# Patient Record
Sex: Female | Born: 1955 | Race: Asian | Hispanic: No | Marital: Single | Smoking: Never smoker
Health system: Southern US, Community
[De-identification: ages and names within clinical notes are randomized; demographics above are authoritative.]

## PROBLEM LIST (undated history)

## (undated) DIAGNOSIS — C50919 Malignant neoplasm of unspecified site of unspecified female breast: Secondary | ICD-10-CM

## (undated) DIAGNOSIS — Z9221 Personal history of antineoplastic chemotherapy: Secondary | ICD-10-CM

## (undated) DIAGNOSIS — I10 Essential (primary) hypertension: Secondary | ICD-10-CM

## (undated) DIAGNOSIS — R05 Cough: Secondary | ICD-10-CM

## (undated) DIAGNOSIS — E785 Hyperlipidemia, unspecified: Secondary | ICD-10-CM

## (undated) DIAGNOSIS — R059 Cough, unspecified: Secondary | ICD-10-CM

## (undated) DIAGNOSIS — F419 Anxiety disorder, unspecified: Secondary | ICD-10-CM

## (undated) DIAGNOSIS — K30 Functional dyspepsia: Secondary | ICD-10-CM

## (undated) HISTORY — DX: Essential (primary) hypertension: I10

## (undated) HISTORY — PX: PORTACATH PLACEMENT: SHX2246

## (undated) HISTORY — DX: Malignant neoplasm of unspecified site of unspecified female breast: C50.919

## (undated) HISTORY — DX: Hyperlipidemia, unspecified: E78.5

## (undated) SURGERY — REMOVAL PORT-A-CATH
Anesthesia: General

---

## 2006-10-20 ENCOUNTER — Emergency Department (HOSPITAL_COMMUNITY): Admission: EM | Admit: 2006-10-20 | Discharge: 2006-10-20 | Payer: Self-pay | Admitting: Emergency Medicine

## 2007-05-03 ENCOUNTER — Ambulatory Visit: Payer: Self-pay | Admitting: Cardiology

## 2007-05-17 ENCOUNTER — Ambulatory Visit: Payer: Self-pay

## 2007-05-17 ENCOUNTER — Encounter: Payer: Self-pay | Admitting: Cardiology

## 2010-02-24 DIAGNOSIS — Z9221 Personal history of antineoplastic chemotherapy: Secondary | ICD-10-CM

## 2010-02-24 HISTORY — DX: Personal history of antineoplastic chemotherapy: Z92.21

## 2010-04-17 ENCOUNTER — Other Ambulatory Visit: Payer: Self-pay | Admitting: Family Medicine

## 2010-04-17 DIAGNOSIS — N6310 Unspecified lump in the right breast, unspecified quadrant: Secondary | ICD-10-CM

## 2010-04-22 ENCOUNTER — Ambulatory Visit
Admission: RE | Admit: 2010-04-22 | Discharge: 2010-04-22 | Disposition: A | Discharge status: Home / Self Care | Payer: Self-pay | Source: Ambulatory Visit | Attending: Family Medicine | Admitting: Family Medicine

## 2010-04-22 ENCOUNTER — Other Ambulatory Visit: Payer: Self-pay | Admitting: Family Medicine

## 2010-04-22 ENCOUNTER — Other Ambulatory Visit: Payer: Self-pay | Admitting: Diagnostic Radiology

## 2010-04-22 DIAGNOSIS — N6310 Unspecified lump in the right breast, unspecified quadrant: Secondary | ICD-10-CM

## 2010-04-23 ENCOUNTER — Other Ambulatory Visit: Payer: Self-pay | Admitting: Family Medicine

## 2010-04-23 DIAGNOSIS — C50911 Malignant neoplasm of unspecified site of right female breast: Secondary | ICD-10-CM

## 2010-04-25 ENCOUNTER — Ambulatory Visit (HOSPITAL_COMMUNITY)
Admission: RE | Admit: 2010-04-25 | Discharge: 2010-04-25 | Disposition: A | Discharge status: Home / Self Care | Payer: Self-pay | Source: Ambulatory Visit | Attending: Family Medicine | Admitting: Family Medicine

## 2010-04-25 DIAGNOSIS — C50911 Malignant neoplasm of unspecified site of right female breast: Secondary | ICD-10-CM

## 2010-04-25 DIAGNOSIS — C50419 Malignant neoplasm of upper-outer quadrant of unspecified female breast: Secondary | ICD-10-CM | POA: Insufficient documentation

## 2010-04-25 MED ORDER — GADOBENATE DIMEGLUMINE 529 MG/ML IV SOLN
13.0000 mL | Freq: Once | INTRAVENOUS | Status: AC | PRN
  Administered 2010-04-25: 13 mL via INTRAVENOUS

## 2010-05-01 ENCOUNTER — Other Ambulatory Visit: Payer: Self-pay | Admitting: Oncology

## 2010-05-01 ENCOUNTER — Encounter (HOSPITAL_BASED_OUTPATIENT_CLINIC_OR_DEPARTMENT_OTHER): Payer: Self-pay | Admitting: Oncology

## 2010-05-01 DIAGNOSIS — C50919 Malignant neoplasm of unspecified site of unspecified female breast: Secondary | ICD-10-CM

## 2010-05-01 DIAGNOSIS — C50419 Malignant neoplasm of upper-outer quadrant of unspecified female breast: Secondary | ICD-10-CM

## 2010-05-01 DIAGNOSIS — Z170 Estrogen receptor positive status [ER+]: Secondary | ICD-10-CM

## 2010-05-01 DIAGNOSIS — R509 Fever, unspecified: Secondary | ICD-10-CM

## 2010-05-01 LAB — CBC WITH DIFFERENTIAL/PLATELET
BASO%: 0.5 % (ref 0.0–2.0)
Basophils Absolute: 0 10*3/uL (ref 0.0–0.1)
EOS%: 7.2 % — ABNORMAL HIGH (ref 0.0–7.0)
HCT: 39.5 % (ref 34.8–46.6)
HGB: 12.6 g/dL (ref 11.6–15.9)
MCH: 23.3 pg — ABNORMAL LOW (ref 25.1–34.0)
MONO#: 0.4 10*3/uL (ref 0.1–0.9)
NEUT#: 4.5 10*3/uL (ref 1.5–6.5)
NEUT%: 61 % (ref 38.4–76.8)
RDW: 13.9 % (ref 11.2–14.5)
WBC: 7.4 10*3/uL (ref 3.9–10.3)
lymph#: 1.9 10*3/uL (ref 0.9–3.3)

## 2010-05-01 LAB — COMPREHENSIVE METABOLIC PANEL
ALT: 20 U/L (ref 0–35)
AST: 18 U/L (ref 0–37)
Albumin: 4.5 g/dL (ref 3.5–5.2)
BUN: 11 mg/dL (ref 6–23)
CO2: 23 mEq/L (ref 19–32)
Calcium: 9.5 mg/dL (ref 8.4–10.5)
Chloride: 102 mEq/L (ref 96–112)
Potassium: 3.8 mEq/L (ref 3.5–5.3)

## 2010-05-01 LAB — CANCER ANTIGEN 27.29: CA 27.29: 12 U/mL (ref 0–39)

## 2010-05-02 ENCOUNTER — Ambulatory Visit
Admission: RE | Admit: 2010-05-02 | Discharge: 2010-05-02 | Disposition: A | Discharge status: Home / Self Care | Payer: Self-pay | Source: Ambulatory Visit | Attending: Oncology | Admitting: Oncology

## 2010-05-02 DIAGNOSIS — C50919 Malignant neoplasm of unspecified site of unspecified female breast: Secondary | ICD-10-CM

## 2010-05-03 ENCOUNTER — Other Ambulatory Visit (HOSPITAL_COMMUNITY): Payer: Self-pay

## 2010-05-06 ENCOUNTER — Encounter (HOSPITAL_COMMUNITY): Payer: Self-pay

## 2010-05-06 ENCOUNTER — Other Ambulatory Visit (HOSPITAL_COMMUNITY): Payer: Self-pay | Admitting: General Surgery

## 2010-05-06 ENCOUNTER — Inpatient Hospital Stay (HOSPITAL_COMMUNITY)
Admission: RE | Admit: 2010-05-06 | Discharge: 2010-05-06 | Disposition: A | Discharge status: Home / Self Care | Payer: Self-pay | Source: Ambulatory Visit

## 2010-05-06 ENCOUNTER — Ambulatory Visit (HOSPITAL_COMMUNITY)
Admission: RE | Admit: 2010-05-06 | Discharge: 2010-05-06 | Disposition: A | Discharge status: Home / Self Care | Payer: Self-pay | Source: Ambulatory Visit | Attending: Oncology | Admitting: Oncology

## 2010-05-06 ENCOUNTER — Ambulatory Visit (HOSPITAL_COMMUNITY)
Admission: RE | Admit: 2010-05-06 | Discharge: 2010-05-06 | Disposition: A | Discharge status: Home / Self Care | Payer: Self-pay | Source: Ambulatory Visit | Attending: General Surgery | Admitting: General Surgery

## 2010-05-06 ENCOUNTER — Other Ambulatory Visit: Payer: Self-pay | Admitting: General Surgery

## 2010-05-06 ENCOUNTER — Ambulatory Visit (HOSPITAL_COMMUNITY): Admission: RE | Admit: 2010-05-06 | Payer: Self-pay | Source: Ambulatory Visit

## 2010-05-06 DIAGNOSIS — R9431 Abnormal electrocardiogram [ECG] [EKG]: Secondary | ICD-10-CM | POA: Insufficient documentation

## 2010-05-06 DIAGNOSIS — Z01812 Encounter for preprocedural laboratory examination: Secondary | ICD-10-CM | POA: Insufficient documentation

## 2010-05-06 DIAGNOSIS — Z01810 Encounter for preprocedural cardiovascular examination: Secondary | ICD-10-CM | POA: Insufficient documentation

## 2010-05-06 DIAGNOSIS — C50919 Malignant neoplasm of unspecified site of unspecified female breast: Secondary | ICD-10-CM | POA: Insufficient documentation

## 2010-05-06 DIAGNOSIS — Z01818 Encounter for other preprocedural examination: Secondary | ICD-10-CM

## 2010-05-06 DIAGNOSIS — I517 Cardiomegaly: Secondary | ICD-10-CM

## 2010-05-07 ENCOUNTER — Ambulatory Visit (HOSPITAL_COMMUNITY): Payer: Self-pay

## 2010-05-07 ENCOUNTER — Other Ambulatory Visit (HOSPITAL_COMMUNITY): Payer: Self-pay

## 2010-05-07 ENCOUNTER — Ambulatory Visit (HOSPITAL_COMMUNITY)
Admission: RE | Admit: 2010-05-07 | Discharge: 2010-05-07 | Disposition: A | Discharge status: Home / Self Care | Payer: Self-pay | Source: Ambulatory Visit | Attending: General Surgery | Admitting: General Surgery

## 2010-05-07 DIAGNOSIS — Z853 Personal history of malignant neoplasm of breast: Secondary | ICD-10-CM

## 2010-05-07 DIAGNOSIS — C50919 Malignant neoplasm of unspecified site of unspecified female breast: Secondary | ICD-10-CM | POA: Insufficient documentation

## 2010-05-07 LAB — URINE MICROSCOPIC-ADD ON

## 2010-05-07 LAB — URINALYSIS, ROUTINE W REFLEX MICROSCOPIC
Bilirubin Urine: NEGATIVE
Glucose, UA: NEGATIVE mg/dL
Ketones, ur: NEGATIVE mg/dL
Protein, ur: NEGATIVE mg/dL
pH: 6 (ref 5.0–8.0)

## 2010-05-09 ENCOUNTER — Encounter (HOSPITAL_COMMUNITY): Payer: Self-pay

## 2010-05-09 ENCOUNTER — Encounter (HOSPITAL_COMMUNITY)
Admission: RE | Admit: 2010-05-09 | Discharge: 2010-05-09 | Disposition: A | Discharge status: Home / Self Care | Payer: Self-pay | Source: Ambulatory Visit | Attending: Oncology | Admitting: Oncology

## 2010-05-09 DIAGNOSIS — C50919 Malignant neoplasm of unspecified site of unspecified female breast: Secondary | ICD-10-CM | POA: Insufficient documentation

## 2010-05-09 LAB — GLUCOSE, CAPILLARY: Glucose-Capillary: 147 mg/dL — ABNORMAL HIGH (ref 70–99)

## 2010-05-09 MED ORDER — FLUDEOXYGLUCOSE F - 18 (FDG) INJECTION
16.3000 | Freq: Once | INTRAVENOUS | Status: AC | PRN
  Administered 2010-05-09: 16.3 via INTRAVENOUS

## 2010-05-10 NOTE — Op Note (Signed)
NAMEJULIYAH, [name]             ACCOUNT NO.:  [id number]  MEDICAL RECORD NO.:  [id number]           PATIENT TYPE:  O  LOCATION:  DAYL                         FACILITY:  [facility]  PHYSICIAN:  Almond Lint, MD       DATE OF BIRTH:  28-Mar-1955  DATE OF PROCEDURE:  05/07/2010 DATE OF DISCHARGE:                              OPERATIVE REPORT   PREOPERATIVE DIAGNOSIS:  Clinical T3 N0 right breast cancer.  POSTOPERATIVE DIAGNOSIS:  Clinical T3 N0 right breast cancer.  PROCEDURE:  Left subclavian Port-A-Cath placement.  SURGEON:  Almond Lint, M.D.  ANESTHESIA:  MAC and local.  FINDINGS:  Good venous return and easy flush, catheter cut at 20 cm.  ESTIMATED BLOOD LOSS:  Minimal.  COMPLICATIONS:  None known.  PROCEDURE:  [name] was identified in the holding area and taken to operating room where she was placed on the operating room table.  MAC anesthesia was induced.  Her arms were tucked and a shoulder roll was placed, and she was placed into Trendelenburg position.  Her chest was prepped and draped in sterile fashion.  Time-out was performed according to surgical safety check list.  When all was correct, we continued.  The infraclavicular skin and underlying region was anesthetized with local anesthetic.  A large-bore needle was used to access the subclavian vein with 1 pass in the needle.  The wire passed easily, and ectopy was seen. The wire was withdrawn until the ectopy stopped.  This was then clamped to the drape.  Additional local anesthetic was infiltrated near the site of the skin and in the area for the pocket.  A 3-cm incision was made incorporating the wire insertion site.  The wire was cut away from the skin with the Metzenbaum scissors.  The dissection was taken down to the fascia with the cautery.  The [employer] was then used to elevate the skin in order to create a pocket on top of the pectoralis fascia.  Four 2-0 Prolene sutures were used to tack the port  down to the pectoralis fascia.  Care was taken to make sure the port fitted in the pocket well. The catheter was laid over the wire and cut to 20 cm to be at the SVC/RA junction.  The tunneler and dilator were placed over the wire under fluoroscopy and then the tunneler sheath was left in place.  The wire and dilator were removed.  The catheter was advanced through the tunneler sheath, and the tunneler sheath was pulled away slowly.  The Port-A-Cath aspirated and flushed easily with a dilute heparin flush. The position was confirmed to be SVC/RA junction on fluoroscopy.  The catheter was then tied down with Prolene sutures and a 3-0 Vicryl was placed at the neck of the catheter and port to hold it down as well. The 3-0 Vicryls were used to place deep dermal sutures in the skin and the catheter position was rechecked and remained in the right position. A 4-0 Monocryl was then used to run the skin in subcuticular fashion.  The wound was then cleaned, dried, and dressed with Dermabond.  The patient tolerated the procedure  well and was allowed to emerge from anesthesia. Taken to PACU in stable condition.  Needle, sponge, and instrument counts were correct x2.     Almond Lint, MD     FB/MEDQ  D:  05/07/2010  T:  05/07/2010  Job:  [id number]  Electronically Signed by Almond Lint MD on 05/10/2010 07:53:57 AM

## 2010-05-13 ENCOUNTER — Encounter (HOSPITAL_BASED_OUTPATIENT_CLINIC_OR_DEPARTMENT_OTHER): Payer: Self-pay | Admitting: Oncology

## 2010-05-13 DIAGNOSIS — C50419 Malignant neoplasm of upper-outer quadrant of unspecified female breast: Secondary | ICD-10-CM

## 2010-05-16 ENCOUNTER — Encounter (HOSPITAL_BASED_OUTPATIENT_CLINIC_OR_DEPARTMENT_OTHER): Payer: Self-pay | Admitting: Oncology

## 2010-05-16 DIAGNOSIS — Z170 Estrogen receptor positive status [ER+]: Secondary | ICD-10-CM

## 2010-05-16 DIAGNOSIS — C50419 Malignant neoplasm of upper-outer quadrant of unspecified female breast: Secondary | ICD-10-CM

## 2010-05-16 DIAGNOSIS — Z5111 Encounter for antineoplastic chemotherapy: Secondary | ICD-10-CM

## 2010-05-16 DIAGNOSIS — Z5112 Encounter for antineoplastic immunotherapy: Secondary | ICD-10-CM

## 2010-05-23 ENCOUNTER — Other Ambulatory Visit: Payer: Self-pay | Admitting: Oncology

## 2010-05-23 ENCOUNTER — Encounter (HOSPITAL_BASED_OUTPATIENT_CLINIC_OR_DEPARTMENT_OTHER): Payer: Self-pay | Admitting: Oncology

## 2010-05-23 DIAGNOSIS — Z5111 Encounter for antineoplastic chemotherapy: Secondary | ICD-10-CM

## 2010-05-23 DIAGNOSIS — Z170 Estrogen receptor positive status [ER+]: Secondary | ICD-10-CM

## 2010-05-23 DIAGNOSIS — Z5112 Encounter for antineoplastic immunotherapy: Secondary | ICD-10-CM

## 2010-05-23 DIAGNOSIS — C50419 Malignant neoplasm of upper-outer quadrant of unspecified female breast: Secondary | ICD-10-CM

## 2010-05-23 LAB — BASIC METABOLIC PANEL
CO2: 23 mEq/L (ref 19–32)
Chloride: 104 mEq/L (ref 96–112)
Glucose, Bld: 244 mg/dL — ABNORMAL HIGH (ref 70–99)
Potassium: 3.9 mEq/L (ref 3.5–5.3)
Sodium: 137 mEq/L (ref 135–145)

## 2010-05-23 LAB — CBC WITH DIFFERENTIAL/PLATELET
Eosinophils Absolute: 0.5 10*3/uL (ref 0.0–0.5)
MONO#: 0.4 10*3/uL (ref 0.1–0.9)
MONO%: 5 % (ref 0.0–14.0)
NEUT#: 4.4 10*3/uL (ref 1.5–6.5)
RBC: 4.85 10*6/uL (ref 3.70–5.45)
RDW: 14.5 % (ref 11.2–14.5)
WBC: 7.6 10*3/uL (ref 3.9–10.3)
lymph#: 2.3 10*3/uL (ref 0.9–3.3)

## 2010-05-30 ENCOUNTER — Other Ambulatory Visit: Payer: Self-pay | Admitting: Physician Assistant

## 2010-05-30 ENCOUNTER — Encounter (HOSPITAL_BASED_OUTPATIENT_CLINIC_OR_DEPARTMENT_OTHER): Payer: Self-pay | Admitting: Oncology

## 2010-05-30 DIAGNOSIS — Z5111 Encounter for antineoplastic chemotherapy: Secondary | ICD-10-CM

## 2010-05-30 DIAGNOSIS — Z5112 Encounter for antineoplastic immunotherapy: Secondary | ICD-10-CM

## 2010-05-30 DIAGNOSIS — C50419 Malignant neoplasm of upper-outer quadrant of unspecified female breast: Secondary | ICD-10-CM

## 2010-05-30 DIAGNOSIS — Z170 Estrogen receptor positive status [ER+]: Secondary | ICD-10-CM

## 2010-05-30 LAB — CBC WITH DIFFERENTIAL/PLATELET
BASO%: 0.9 % (ref 0.0–2.0)
HCT: 37.6 % (ref 34.8–46.6)
LYMPH%: 34.8 % (ref 14.0–49.7)
MCHC: 31.9 g/dL (ref 31.5–36.0)
MONO#: 0.4 10*3/uL (ref 0.1–0.9)
NEUT%: 52.5 % (ref 38.4–76.8)
Platelets: 339 10*3/uL (ref 145–400)
WBC: 5.6 10*3/uL (ref 3.9–10.3)

## 2010-06-06 ENCOUNTER — Encounter (HOSPITAL_BASED_OUTPATIENT_CLINIC_OR_DEPARTMENT_OTHER): Payer: Self-pay | Admitting: Oncology

## 2010-06-06 ENCOUNTER — Other Ambulatory Visit: Payer: Self-pay | Admitting: Oncology

## 2010-06-06 DIAGNOSIS — R509 Fever, unspecified: Secondary | ICD-10-CM

## 2010-06-06 DIAGNOSIS — Z170 Estrogen receptor positive status [ER+]: Secondary | ICD-10-CM

## 2010-06-06 DIAGNOSIS — Z5112 Encounter for antineoplastic immunotherapy: Secondary | ICD-10-CM

## 2010-06-06 DIAGNOSIS — C50419 Malignant neoplasm of upper-outer quadrant of unspecified female breast: Secondary | ICD-10-CM

## 2010-06-06 LAB — CBC WITH DIFFERENTIAL/PLATELET
BASO%: 1 % (ref 0.0–2.0)
Eosinophils Absolute: 0.3 10*3/uL (ref 0.0–0.5)
HCT: 38.2 % (ref 34.8–46.6)
HGB: 12.1 g/dL (ref 11.6–15.9)
MCH: 23.6 pg — ABNORMAL LOW (ref 25.1–34.0)
MCHC: 31.7 g/dL (ref 31.5–36.0)
MCV: 74.5 fL — ABNORMAL LOW (ref 79.5–101.0)
MONO#: 0.4 10*3/uL (ref 0.1–0.9)
RBC: 5.13 10*6/uL (ref 3.70–5.45)
RDW: 15.3 % — ABNORMAL HIGH (ref 11.2–14.5)
lymph#: 2.2 10*3/uL (ref 0.9–3.3)
nRBC: 0 % (ref 0–0)

## 2010-06-06 LAB — BASIC METABOLIC PANEL
CO2: 24 mEq/L (ref 19–32)
Chloride: 106 mEq/L (ref 96–112)
Creatinine, Ser: 0.8 mg/dL (ref 0.40–1.20)
Potassium: 3.7 mEq/L (ref 3.5–5.3)
Sodium: 141 mEq/L (ref 135–145)

## 2010-06-13 ENCOUNTER — Other Ambulatory Visit: Payer: Self-pay | Admitting: Physician Assistant

## 2010-06-13 ENCOUNTER — Encounter (HOSPITAL_BASED_OUTPATIENT_CLINIC_OR_DEPARTMENT_OTHER): Payer: Self-pay | Admitting: Oncology

## 2010-06-13 DIAGNOSIS — Z5112 Encounter for antineoplastic immunotherapy: Secondary | ICD-10-CM

## 2010-06-13 DIAGNOSIS — C50419 Malignant neoplasm of upper-outer quadrant of unspecified female breast: Secondary | ICD-10-CM

## 2010-06-13 DIAGNOSIS — Z170 Estrogen receptor positive status [ER+]: Secondary | ICD-10-CM

## 2010-06-13 DIAGNOSIS — Z5111 Encounter for antineoplastic chemotherapy: Secondary | ICD-10-CM

## 2010-06-13 LAB — CBC WITH DIFFERENTIAL/PLATELET
Basophils Absolute: 0.1 10*3/uL (ref 0.0–0.1)
EOS%: 5.7 % (ref 0.0–7.0)
Eosinophils Absolute: 0.4 10*3/uL (ref 0.0–0.5)
HCT: 38 % (ref 34.8–46.6)
HGB: 12.1 g/dL (ref 11.6–15.9)
MCH: 23.5 pg — ABNORMAL LOW (ref 25.1–34.0)
MCV: 73.8 fL — ABNORMAL LOW (ref 79.5–101.0)
MONO%: 8.7 % (ref 0.0–14.0)
NEUT#: 3.7 10*3/uL (ref 1.5–6.5)
NEUT%: 52.2 % (ref 38.4–76.8)
Platelets: 301 10*3/uL (ref 145–400)
RDW: 15.3 % — ABNORMAL HIGH (ref 11.2–14.5)

## 2010-06-13 LAB — COMPREHENSIVE METABOLIC PANEL
Albumin: 4.6 g/dL (ref 3.5–5.2)
BUN: 10 mg/dL (ref 6–23)
CO2: 24 mEq/L (ref 19–32)
Glucose, Bld: 168 mg/dL — ABNORMAL HIGH (ref 70–99)
Sodium: 141 mEq/L (ref 135–145)
Total Bilirubin: 0.5 mg/dL (ref 0.3–1.2)
Total Protein: 7.3 g/dL (ref 6.0–8.3)

## 2010-06-20 ENCOUNTER — Encounter (HOSPITAL_BASED_OUTPATIENT_CLINIC_OR_DEPARTMENT_OTHER): Payer: Self-pay | Admitting: Oncology

## 2010-06-20 ENCOUNTER — Other Ambulatory Visit: Payer: Self-pay | Admitting: Physician Assistant

## 2010-06-20 DIAGNOSIS — C50419 Malignant neoplasm of upper-outer quadrant of unspecified female breast: Secondary | ICD-10-CM

## 2010-06-20 DIAGNOSIS — Z5112 Encounter for antineoplastic immunotherapy: Secondary | ICD-10-CM

## 2010-06-20 DIAGNOSIS — Z5111 Encounter for antineoplastic chemotherapy: Secondary | ICD-10-CM

## 2010-06-20 DIAGNOSIS — Z170 Estrogen receptor positive status [ER+]: Secondary | ICD-10-CM

## 2010-06-20 LAB — CBC WITH DIFFERENTIAL/PLATELET
Basophils Absolute: 0.1 10*3/uL (ref 0.0–0.1)
Eosinophils Absolute: 0.6 10*3/uL — ABNORMAL HIGH (ref 0.0–0.5)
HGB: 12.4 g/dL (ref 11.6–15.9)
MONO#: 0.5 10*3/uL (ref 0.1–0.9)
NEUT#: 6.7 10*3/uL — ABNORMAL HIGH (ref 1.5–6.5)
Platelets: 343 10*3/uL (ref 145–400)
RBC: 5.29 10*6/uL (ref 3.70–5.45)
RDW: 15.3 % — ABNORMAL HIGH (ref 11.2–14.5)
WBC: 10.4 10*3/uL — ABNORMAL HIGH (ref 3.9–10.3)
nRBC: 0 % (ref 0–0)

## 2010-06-27 ENCOUNTER — Other Ambulatory Visit: Payer: Self-pay | Admitting: Physician Assistant

## 2010-06-27 ENCOUNTER — Encounter (HOSPITAL_BASED_OUTPATIENT_CLINIC_OR_DEPARTMENT_OTHER): Payer: Self-pay | Admitting: Oncology

## 2010-06-27 DIAGNOSIS — Z5111 Encounter for antineoplastic chemotherapy: Secondary | ICD-10-CM

## 2010-06-27 DIAGNOSIS — C50419 Malignant neoplasm of upper-outer quadrant of unspecified female breast: Secondary | ICD-10-CM

## 2010-06-27 DIAGNOSIS — Z170 Estrogen receptor positive status [ER+]: Secondary | ICD-10-CM

## 2010-06-27 DIAGNOSIS — Z5112 Encounter for antineoplastic immunotherapy: Secondary | ICD-10-CM

## 2010-06-27 LAB — CBC WITH DIFFERENTIAL/PLATELET
BASO%: 0.7 % (ref 0.0–2.0)
Basophils Absolute: 0.1 10*3/uL (ref 0.0–0.1)
Eosinophils Absolute: 0.4 10*3/uL (ref 0.0–0.5)
HCT: 36 % (ref 34.8–46.6)
HGB: 11.5 g/dL — ABNORMAL LOW (ref 11.6–15.9)
MONO#: 0.6 10*3/uL (ref 0.1–0.9)
NEUT#: 4.7 10*3/uL (ref 1.5–6.5)
NEUT%: 56.8 % (ref 38.4–76.8)
WBC: 8.2 10*3/uL (ref 3.9–10.3)
lymph#: 2.5 10*3/uL (ref 0.9–3.3)

## 2010-06-30 ENCOUNTER — Emergency Department (HOSPITAL_COMMUNITY)
Admission: EM | Admit: 2010-06-30 | Discharge: 2010-07-01 | Disposition: A | Discharge status: Home / Self Care | Payer: Self-pay | Attending: Emergency Medicine | Admitting: Emergency Medicine

## 2010-06-30 ENCOUNTER — Encounter (HOSPITAL_COMMUNITY): Payer: Self-pay

## 2010-06-30 ENCOUNTER — Emergency Department (HOSPITAL_COMMUNITY): Payer: Self-pay

## 2010-06-30 DIAGNOSIS — I10 Essential (primary) hypertension: Secondary | ICD-10-CM | POA: Insufficient documentation

## 2010-06-30 DIAGNOSIS — R079 Chest pain, unspecified: Secondary | ICD-10-CM | POA: Insufficient documentation

## 2010-06-30 DIAGNOSIS — C50919 Malignant neoplasm of unspecified site of unspecified female breast: Secondary | ICD-10-CM | POA: Insufficient documentation

## 2010-06-30 DIAGNOSIS — IMO0001 Reserved for inherently not codable concepts without codable children: Secondary | ICD-10-CM | POA: Insufficient documentation

## 2010-06-30 DIAGNOSIS — Z79899 Other long term (current) drug therapy: Secondary | ICD-10-CM | POA: Insufficient documentation

## 2010-06-30 LAB — DIFFERENTIAL
Basophils Absolute: 0 10*3/uL (ref 0.0–0.1)
Basophils Relative: 1 % (ref 0–1)
Eosinophils Absolute: 0.2 10*3/uL (ref 0.0–0.7)
Eosinophils Relative: 2 % (ref 0–5)
Lymphocytes Relative: 30 % (ref 12–46)
Lymphs Abs: 2 10*3/uL (ref 0.7–4.0)
Monocytes Absolute: 0.3 10*3/uL (ref 0.1–1.0)
Monocytes Relative: 4 % (ref 3–12)
Neutro Abs: 4.1 10*3/uL (ref 1.7–7.7)
Neutrophils Relative %: 63 % (ref 43–77)

## 2010-06-30 LAB — CBC
MCH: 23.9 pg — ABNORMAL LOW (ref 26.0–34.0)
MCV: 73.9 fL — ABNORMAL LOW (ref 78.0–100.0)
Platelets: 342 10*3/uL (ref 150–400)
RBC: 5.45 MIL/uL — ABNORMAL HIGH (ref 3.87–5.11)
RDW: 15.2 % (ref 11.5–15.5)
WBC: 6.6 10*3/uL (ref 4.0–10.5)

## 2010-06-30 LAB — POCT I-STAT, CHEM 8
BUN: 8 mg/dL (ref 6–23)
Chloride: 102 mEq/L (ref 96–112)
Creatinine, Ser: 0.8 mg/dL (ref 0.4–1.2)
Glucose, Bld: 148 mg/dL — ABNORMAL HIGH (ref 70–99)
HCT: 46 % (ref 36.0–46.0)
Potassium: 3.5 mEq/L (ref 3.5–5.1)

## 2010-06-30 LAB — URINALYSIS, ROUTINE W REFLEX MICROSCOPIC
Bilirubin Urine: NEGATIVE
Ketones, ur: NEGATIVE mg/dL
Nitrite: NEGATIVE
Specific Gravity, Urine: 1.013 (ref 1.005–1.030)
Urobilinogen, UA: 0.2 mg/dL (ref 0.0–1.0)
pH: 7 (ref 5.0–8.0)

## 2010-06-30 LAB — URINE MICROSCOPIC-ADD ON

## 2010-06-30 MED ORDER — IOHEXOL 300 MG/ML  SOLN
100.0000 mL | Freq: Once | INTRAMUSCULAR | Status: AC | PRN
  Administered 2010-06-30: 100 mL via INTRAVENOUS

## 2010-07-04 ENCOUNTER — Other Ambulatory Visit: Payer: Self-pay | Admitting: Physician Assistant

## 2010-07-04 ENCOUNTER — Encounter (HOSPITAL_BASED_OUTPATIENT_CLINIC_OR_DEPARTMENT_OTHER): Payer: Self-pay | Admitting: Oncology

## 2010-07-04 DIAGNOSIS — C50919 Malignant neoplasm of unspecified site of unspecified female breast: Secondary | ICD-10-CM

## 2010-07-04 DIAGNOSIS — C50419 Malignant neoplasm of upper-outer quadrant of unspecified female breast: Secondary | ICD-10-CM

## 2010-07-04 DIAGNOSIS — Z5112 Encounter for antineoplastic immunotherapy: Secondary | ICD-10-CM

## 2010-07-04 DIAGNOSIS — Z170 Estrogen receptor positive status [ER+]: Secondary | ICD-10-CM

## 2010-07-04 LAB — CBC WITH DIFFERENTIAL/PLATELET
BASO%: 0.4 % (ref 0.0–2.0)
EOS%: 2.2 % (ref 0.0–7.0)
HCT: 37.7 % (ref 34.8–46.6)
LYMPH%: 26.8 % (ref 14.0–49.7)
MCH: 23.4 pg — ABNORMAL LOW (ref 25.1–34.0)
MCHC: 31.8 g/dL (ref 31.5–36.0)
MONO%: 6.5 % (ref 0.0–14.0)
NEUT%: 64.1 % (ref 38.4–76.8)
Platelets: 345 10*3/uL (ref 145–400)
RBC: 5.12 10*6/uL (ref 3.70–5.45)
nRBC: 0 % (ref 0–0)

## 2010-07-09 NOTE — Assessment & Plan Note (Signed)
[name]                            CARDIOLOGY OFFICE NOTE   NAME:[name], [name]                      MRN:          [id number]  DATE:05/03/2007                            DOB:          [birth year]    The patient is 55 years old.  She had been seen at [facility]  She was  having some chest discomfort.  She does not speak [place].  She is here  with a family member today.  She has had some abdominal discomfort and  also some slight chest discomfort.  The chest discomfort was not  exertional.  Her EKG reveals some nonspecific ST-T wave changes.  She  was referred for further evaluation to be sure that her cardiac status  was stable.  There was some increased voltage on her EKG.   PAST MEDICAL HISTORY:   ALLERGIES:  There is history of allergy to ASPIRIN.   MEDICATIONS:  1. Hydrochlorothiazide 25.  2. Potassium 20.  3. Prilosec.   OTHER MEDICAL PROBLEMS:  See the list below.   SOCIAL HISTORY:  She is single and works doing Psychologist, educational   FAMILY HISTORY:  We cannot obtain a good family history.   REVIEW OF SYSTEMS:  As of today, she has very mild rare intermittent GI  symptoms.  Otherwise, her review of systems is negative.   PHYSICAL EXAM:  Blood pressure is 123/80 with a pulse of 68.  The patient is oriented to person, time and place.  Affect is normal.  She communicates through her family member.  HEENT:  Reveals no xanthelasma.  She has normal extraocular motion.  There are no carotid bruits.  There is no jugular venous distention.  LUNGS:  Clear.  Respiratory effort is not labored.  CARDIAC:  Exam reveals S1 with an S2.  There are no clicks or  significant murmurs.  ABDOMEN:  Soft.  She has normal distal pulses.  There is no peripheral  edema.   I have an EKG from April 03, 2007.  There is no repeat done.  There  are nonspecific ST-T wave changes.   PROBLEMS:  1. Recent mild gastrointestinal symptoms, on medication.  2. Recent  chest pain.  At this point, we have no proof of an acute      coronary syndrome.  She appears to be stable.  3. Mild nonspecific ST-T wave changes.   The patient is allowed to continue to do her work up.  She will have a 2-  D echocardiogram to be sure she has normal left ventricular function.  I  will then see her back for followup.  No change in her medications at  this time.     Luis Abed, MD, [facility]  Electronically Signed    JDK/MedQ  DD: 05/03/2007  DT: 05/04/2007  Job #: [id number]   cc:   [name], [address] [place], [place] [address] Sabrina Gobble PA-C

## 2010-07-11 ENCOUNTER — Other Ambulatory Visit: Payer: Self-pay | Admitting: Oncology

## 2010-07-11 ENCOUNTER — Other Ambulatory Visit: Payer: Self-pay | Admitting: Physician Assistant

## 2010-07-11 ENCOUNTER — Encounter (HOSPITAL_BASED_OUTPATIENT_CLINIC_OR_DEPARTMENT_OTHER): Payer: Self-pay | Admitting: Oncology

## 2010-07-11 ENCOUNTER — Ambulatory Visit (HOSPITAL_COMMUNITY)
Admission: RE | Admit: 2010-07-11 | Discharge: 2010-07-11 | Disposition: A | Discharge status: Home / Self Care | Payer: Self-pay | Source: Ambulatory Visit | Attending: Oncology | Admitting: Oncology

## 2010-07-11 DIAGNOSIS — C50919 Malignant neoplasm of unspecified site of unspecified female breast: Secondary | ICD-10-CM | POA: Insufficient documentation

## 2010-07-11 DIAGNOSIS — M549 Dorsalgia, unspecified: Secondary | ICD-10-CM

## 2010-07-11 DIAGNOSIS — Z5112 Encounter for antineoplastic immunotherapy: Secondary | ICD-10-CM

## 2010-07-11 DIAGNOSIS — C50419 Malignant neoplasm of upper-outer quadrant of unspecified female breast: Secondary | ICD-10-CM

## 2010-07-11 DIAGNOSIS — Z5111 Encounter for antineoplastic chemotherapy: Secondary | ICD-10-CM

## 2010-07-11 DIAGNOSIS — M542 Cervicalgia: Secondary | ICD-10-CM | POA: Insufficient documentation

## 2010-07-11 DIAGNOSIS — Z170 Estrogen receptor positive status [ER+]: Secondary | ICD-10-CM

## 2010-07-11 DIAGNOSIS — M546 Pain in thoracic spine: Secondary | ICD-10-CM | POA: Insufficient documentation

## 2010-07-11 LAB — COMPREHENSIVE METABOLIC PANEL
AST: 18 U/L (ref 0–37)
Alkaline Phosphatase: 59 U/L (ref 39–117)
Glucose, Bld: 154 mg/dL — ABNORMAL HIGH (ref 70–99)
Sodium: 139 mEq/L (ref 135–145)
Total Bilirubin: 0.5 mg/dL (ref 0.3–1.2)
Total Protein: 7 g/dL (ref 6.0–8.3)

## 2010-07-11 LAB — CBC WITH DIFFERENTIAL/PLATELET
BASO%: 0.9 % (ref 0.0–2.0)
Basophils Absolute: 0.1 10*3/uL (ref 0.0–0.1)
EOS%: 3.1 % (ref 0.0–7.0)
HGB: 11.9 g/dL (ref 11.6–15.9)
MCH: 23.5 pg — ABNORMAL LOW (ref 25.1–34.0)
MCHC: 31.4 g/dL — ABNORMAL LOW (ref 31.5–36.0)
MCV: 74.8 fL — ABNORMAL LOW (ref 79.5–101.0)
MONO%: 9.7 % (ref 0.0–14.0)
RBC: 5.07 10*6/uL (ref 3.70–5.45)
RDW: 15.3 % — ABNORMAL HIGH (ref 11.2–14.5)

## 2010-07-17 ENCOUNTER — Ambulatory Visit (HOSPITAL_COMMUNITY)
Admission: RE | Admit: 2010-07-17 | Discharge: 2010-07-17 | Disposition: A | Discharge status: Home / Self Care | Payer: Self-pay | Source: Ambulatory Visit | Attending: Oncology | Admitting: Oncology

## 2010-07-17 DIAGNOSIS — M79609 Pain in unspecified limb: Secondary | ICD-10-CM | POA: Insufficient documentation

## 2010-07-17 DIAGNOSIS — M549 Dorsalgia, unspecified: Secondary | ICD-10-CM | POA: Insufficient documentation

## 2010-07-17 DIAGNOSIS — Z853 Personal history of malignant neoplasm of breast: Secondary | ICD-10-CM | POA: Insufficient documentation

## 2010-07-17 DIAGNOSIS — R29898 Other symptoms and signs involving the musculoskeletal system: Secondary | ICD-10-CM | POA: Insufficient documentation

## 2010-07-17 DIAGNOSIS — C50919 Malignant neoplasm of unspecified site of unspecified female breast: Secondary | ICD-10-CM

## 2010-07-17 MED ORDER — GADOBENATE DIMEGLUMINE 529 MG/ML IV SOLN
15.0000 mL | Freq: Once | INTRAVENOUS | Status: AC | PRN
  Administered 2010-07-17: 15 mL via INTRAVENOUS

## 2010-07-18 ENCOUNTER — Other Ambulatory Visit: Payer: Self-pay | Admitting: Physician Assistant

## 2010-07-18 ENCOUNTER — Encounter (HOSPITAL_BASED_OUTPATIENT_CLINIC_OR_DEPARTMENT_OTHER): Payer: Self-pay | Admitting: Oncology

## 2010-07-18 DIAGNOSIS — Z5112 Encounter for antineoplastic immunotherapy: Secondary | ICD-10-CM

## 2010-07-18 DIAGNOSIS — Z170 Estrogen receptor positive status [ER+]: Secondary | ICD-10-CM

## 2010-07-18 DIAGNOSIS — Z5111 Encounter for antineoplastic chemotherapy: Secondary | ICD-10-CM

## 2010-07-18 DIAGNOSIS — C50419 Malignant neoplasm of upper-outer quadrant of unspecified female breast: Secondary | ICD-10-CM

## 2010-07-18 LAB — CBC WITH DIFFERENTIAL/PLATELET
Basophils Absolute: 0 10*3/uL (ref 0.0–0.1)
Eosinophils Absolute: 0.5 10*3/uL (ref 0.0–0.5)
HGB: 11.3 g/dL — ABNORMAL LOW (ref 11.6–15.9)
LYMPH%: 25.9 % (ref 14.0–49.7)
MCH: 23.4 pg — ABNORMAL LOW (ref 25.1–34.0)
MCV: 74.5 fL — ABNORMAL LOW (ref 79.5–101.0)
MONO%: 5.5 % (ref 0.0–14.0)
NEUT#: 5.3 10*3/uL (ref 1.5–6.5)
Platelets: 285 10*3/uL (ref 145–400)
RBC: 4.82 10*6/uL (ref 3.70–5.45)

## 2010-07-25 ENCOUNTER — Other Ambulatory Visit: Payer: Self-pay | Admitting: Oncology

## 2010-07-25 ENCOUNTER — Encounter (HOSPITAL_BASED_OUTPATIENT_CLINIC_OR_DEPARTMENT_OTHER): Payer: Self-pay | Admitting: Oncology

## 2010-07-25 ENCOUNTER — Other Ambulatory Visit: Payer: Self-pay | Admitting: Physician Assistant

## 2010-07-25 DIAGNOSIS — C50919 Malignant neoplasm of unspecified site of unspecified female breast: Secondary | ICD-10-CM

## 2010-07-25 DIAGNOSIS — Z5111 Encounter for antineoplastic chemotherapy: Secondary | ICD-10-CM

## 2010-07-25 DIAGNOSIS — Z170 Estrogen receptor positive status [ER+]: Secondary | ICD-10-CM

## 2010-07-25 DIAGNOSIS — Z5112 Encounter for antineoplastic immunotherapy: Secondary | ICD-10-CM

## 2010-07-25 DIAGNOSIS — C50419 Malignant neoplasm of upper-outer quadrant of unspecified female breast: Secondary | ICD-10-CM

## 2010-07-25 LAB — CBC WITH DIFFERENTIAL/PLATELET
BASO%: 0.6 % (ref 0.0–2.0)
EOS%: 5.2 % (ref 0.0–7.0)
HCT: 36.1 % (ref 34.8–46.6)
LYMPH%: 29.5 % (ref 14.0–49.7)
MCH: 23.6 pg — ABNORMAL LOW (ref 25.1–34.0)
MCHC: 31.9 g/dL (ref 31.5–36.0)
MONO%: 9.3 % (ref 0.0–14.0)
NEUT%: 55.4 % (ref 38.4–76.8)
Platelets: 284 10*3/uL (ref 145–400)

## 2010-07-28 ENCOUNTER — Ambulatory Visit (HOSPITAL_COMMUNITY)
Admission: RE | Admit: 2010-07-28 | Discharge: 2010-07-28 | Disposition: A | Discharge status: Home / Self Care | Payer: Self-pay | Source: Ambulatory Visit | Attending: Oncology | Admitting: Oncology

## 2010-07-28 DIAGNOSIS — C50919 Malignant neoplasm of unspecified site of unspecified female breast: Secondary | ICD-10-CM

## 2010-07-28 DIAGNOSIS — C50419 Malignant neoplasm of upper-outer quadrant of unspecified female breast: Secondary | ICD-10-CM | POA: Insufficient documentation

## 2010-07-28 MED ORDER — GADOBENATE DIMEGLUMINE 529 MG/ML IV SOLN
11.0000 mL | Freq: Once | INTRAVENOUS | Status: AC | PRN
  Administered 2010-07-28: 11 mL via INTRAVENOUS

## 2010-08-01 ENCOUNTER — Ambulatory Visit (HOSPITAL_COMMUNITY)
Admission: RE | Admit: 2010-08-01 | Discharge: 2010-08-01 | Disposition: A | Discharge status: Home / Self Care | Payer: Self-pay | Source: Ambulatory Visit | Attending: Oncology | Admitting: Oncology

## 2010-08-01 ENCOUNTER — Other Ambulatory Visit: Payer: Self-pay | Admitting: Physician Assistant

## 2010-08-01 ENCOUNTER — Encounter (HOSPITAL_BASED_OUTPATIENT_CLINIC_OR_DEPARTMENT_OTHER): Payer: Self-pay | Admitting: Oncology

## 2010-08-01 DIAGNOSIS — I428 Other cardiomyopathies: Secondary | ICD-10-CM | POA: Insufficient documentation

## 2010-08-01 DIAGNOSIS — E785 Hyperlipidemia, unspecified: Secondary | ICD-10-CM | POA: Insufficient documentation

## 2010-08-01 DIAGNOSIS — Z170 Estrogen receptor positive status [ER+]: Secondary | ICD-10-CM

## 2010-08-01 DIAGNOSIS — I10 Essential (primary) hypertension: Secondary | ICD-10-CM | POA: Insufficient documentation

## 2010-08-01 DIAGNOSIS — C50419 Malignant neoplasm of upper-outer quadrant of unspecified female breast: Secondary | ICD-10-CM

## 2010-08-01 DIAGNOSIS — Z5112 Encounter for antineoplastic immunotherapy: Secondary | ICD-10-CM

## 2010-08-01 DIAGNOSIS — C50919 Malignant neoplasm of unspecified site of unspecified female breast: Secondary | ICD-10-CM | POA: Insufficient documentation

## 2010-08-01 LAB — CBC WITH DIFFERENTIAL/PLATELET
Basophils Absolute: 0 10*3/uL (ref 0.0–0.1)
EOS%: 3.7 % (ref 0.0–7.0)
MCH: 23.8 pg — ABNORMAL LOW (ref 25.1–34.0)
MCV: 75.8 fL — ABNORMAL LOW (ref 79.5–101.0)
MONO%: 8.1 % (ref 0.0–14.0)
RBC: 5 10*6/uL (ref 3.70–5.45)
RDW: 15.3 % — ABNORMAL HIGH (ref 11.2–14.5)

## 2010-08-08 ENCOUNTER — Other Ambulatory Visit: Payer: Self-pay | Admitting: Oncology

## 2010-08-08 ENCOUNTER — Encounter (HOSPITAL_BASED_OUTPATIENT_CLINIC_OR_DEPARTMENT_OTHER): Payer: Self-pay | Admitting: Oncology

## 2010-08-08 DIAGNOSIS — Z5112 Encounter for antineoplastic immunotherapy: Secondary | ICD-10-CM

## 2010-08-08 DIAGNOSIS — C50419 Malignant neoplasm of upper-outer quadrant of unspecified female breast: Secondary | ICD-10-CM

## 2010-08-08 DIAGNOSIS — Z5111 Encounter for antineoplastic chemotherapy: Secondary | ICD-10-CM

## 2010-08-08 DIAGNOSIS — Z170 Estrogen receptor positive status [ER+]: Secondary | ICD-10-CM

## 2010-08-08 LAB — COMPREHENSIVE METABOLIC PANEL
ALT: 21 U/L (ref 0–35)
AST: 14 U/L (ref 0–37)
Alkaline Phosphatase: 62 U/L (ref 39–117)
Chloride: 104 mEq/L (ref 96–112)
Creatinine, Ser: 0.85 mg/dL (ref 0.50–1.10)
Total Bilirubin: 0.4 mg/dL (ref 0.3–1.2)

## 2010-08-08 LAB — CBC WITH DIFFERENTIAL/PLATELET
BASO%: 0.6 % (ref 0.0–2.0)
EOS%: 4.4 % (ref 0.0–7.0)
LYMPH%: 29.4 % (ref 14.0–49.7)
MCHC: 31 g/dL — ABNORMAL LOW (ref 31.5–36.0)
MONO#: 0.8 10*3/uL (ref 0.1–0.9)
MONO%: 9.5 % (ref 0.0–14.0)
Platelets: 259 10*3/uL (ref 145–400)
RBC: 5.01 10*6/uL (ref 3.70–5.45)
WBC: 8.1 10*3/uL (ref 3.9–10.3)
nRBC: 0 % (ref 0–0)

## 2010-08-15 ENCOUNTER — Encounter (HOSPITAL_BASED_OUTPATIENT_CLINIC_OR_DEPARTMENT_OTHER): Payer: Self-pay | Admitting: Oncology

## 2010-08-15 ENCOUNTER — Other Ambulatory Visit: Payer: Self-pay | Admitting: Oncology

## 2010-08-15 ENCOUNTER — Ambulatory Visit (HOSPITAL_COMMUNITY)
Admission: RE | Admit: 2010-08-15 | Discharge: 2010-08-15 | Disposition: A | Discharge status: Home / Self Care | Payer: Self-pay | Source: Ambulatory Visit | Attending: Oncology | Admitting: Oncology

## 2010-08-15 ENCOUNTER — Other Ambulatory Visit: Payer: Self-pay | Admitting: Physician Assistant

## 2010-08-15 DIAGNOSIS — I2699 Other pulmonary embolism without acute cor pulmonale: Secondary | ICD-10-CM | POA: Insufficient documentation

## 2010-08-15 DIAGNOSIS — Z170 Estrogen receptor positive status [ER+]: Secondary | ICD-10-CM

## 2010-08-15 DIAGNOSIS — R0789 Other chest pain: Secondary | ICD-10-CM | POA: Insufficient documentation

## 2010-08-15 DIAGNOSIS — C50919 Malignant neoplasm of unspecified site of unspecified female breast: Secondary | ICD-10-CM

## 2010-08-15 DIAGNOSIS — C50419 Malignant neoplasm of upper-outer quadrant of unspecified female breast: Secondary | ICD-10-CM

## 2010-08-15 DIAGNOSIS — Z5111 Encounter for antineoplastic chemotherapy: Secondary | ICD-10-CM

## 2010-08-15 DIAGNOSIS — J984 Other disorders of lung: Secondary | ICD-10-CM | POA: Insufficient documentation

## 2010-08-15 DIAGNOSIS — M79609 Pain in unspecified limb: Secondary | ICD-10-CM

## 2010-08-15 DIAGNOSIS — M549 Dorsalgia, unspecified: Secondary | ICD-10-CM | POA: Insufficient documentation

## 2010-08-15 LAB — CBC WITH DIFFERENTIAL/PLATELET
BASO%: 0.6 % (ref 0.0–2.0)
Basophils Absolute: 0.1 10*3/uL (ref 0.0–0.1)
EOS%: 7 % (ref 0.0–7.0)
HCT: 37.8 % (ref 34.8–46.6)
LYMPH%: 23.5 % (ref 14.0–49.7)
MCH: 23.6 pg — ABNORMAL LOW (ref 25.1–34.0)
MCHC: 32 g/dL (ref 31.5–36.0)
MCV: 73.8 fL — ABNORMAL LOW (ref 79.5–101.0)
NEUT%: 62.2 % (ref 38.4–76.8)
Platelets: 334 10*3/uL (ref 145–400)

## 2010-08-15 MED ORDER — IOHEXOL 300 MG/ML  SOLN
80.0000 mL | Freq: Once | INTRAMUSCULAR | Status: AC | PRN
  Administered 2010-08-15: 80 mL via INTRAVENOUS

## 2010-08-16 ENCOUNTER — Ambulatory Visit (HOSPITAL_COMMUNITY)
Admission: RE | Admit: 2010-08-16 | Discharge: 2010-08-16 | Disposition: A | Discharge status: Home / Self Care | Payer: Self-pay | Source: Ambulatory Visit | Attending: Oncology | Admitting: Oncology

## 2010-08-16 DIAGNOSIS — I2699 Other pulmonary embolism without acute cor pulmonale: Secondary | ICD-10-CM | POA: Insufficient documentation

## 2010-08-16 DIAGNOSIS — T81718A Complication of other artery following a procedure, not elsewhere classified, initial encounter: Secondary | ICD-10-CM

## 2010-08-22 ENCOUNTER — Encounter (HOSPITAL_BASED_OUTPATIENT_CLINIC_OR_DEPARTMENT_OTHER): Payer: Self-pay | Admitting: Oncology

## 2010-08-22 ENCOUNTER — Other Ambulatory Visit: Payer: Self-pay | Admitting: Oncology

## 2010-08-22 DIAGNOSIS — Z5112 Encounter for antineoplastic immunotherapy: Secondary | ICD-10-CM

## 2010-08-22 DIAGNOSIS — Z170 Estrogen receptor positive status [ER+]: Secondary | ICD-10-CM

## 2010-08-22 DIAGNOSIS — Z5111 Encounter for antineoplastic chemotherapy: Secondary | ICD-10-CM

## 2010-08-22 DIAGNOSIS — C50419 Malignant neoplasm of upper-outer quadrant of unspecified female breast: Secondary | ICD-10-CM

## 2010-08-22 LAB — CBC WITH DIFFERENTIAL/PLATELET
BASO%: 0.7 % (ref 0.0–2.0)
EOS%: 5.8 % (ref 0.0–7.0)
MCH: 23.3 pg — ABNORMAL LOW (ref 25.1–34.0)
MCHC: 31.1 g/dL — ABNORMAL LOW (ref 31.5–36.0)
MCV: 74.8 fL — ABNORMAL LOW (ref 79.5–101.0)
MONO%: 10 % (ref 0.0–14.0)
NEUT%: 57.9 % (ref 38.4–76.8)
RDW: 14.9 % — ABNORMAL HIGH (ref 11.2–14.5)
lymph#: 1.8 10*3/uL (ref 0.9–3.3)

## 2010-08-27 ENCOUNTER — Encounter (INDEPENDENT_AMBULATORY_CARE_PROVIDER_SITE_OTHER): Payer: Self-pay | Admitting: General Surgery

## 2010-08-27 ENCOUNTER — Other Ambulatory Visit (INDEPENDENT_AMBULATORY_CARE_PROVIDER_SITE_OTHER): Payer: Self-pay | Admitting: General Surgery

## 2010-08-27 ENCOUNTER — Ambulatory Visit (INDEPENDENT_AMBULATORY_CARE_PROVIDER_SITE_OTHER): Payer: Self-pay | Admitting: General Surgery

## 2010-08-27 VITALS — Temp 99.6°F

## 2010-08-27 DIAGNOSIS — C50411 Malignant neoplasm of upper-outer quadrant of right female breast: Secondary | ICD-10-CM | POA: Insufficient documentation

## 2010-08-27 DIAGNOSIS — C50911 Malignant neoplasm of unspecified site of right female breast: Secondary | ICD-10-CM

## 2010-08-27 DIAGNOSIS — C50919 Malignant neoplasm of unspecified site of unspecified female breast: Secondary | ICD-10-CM

## 2010-08-27 MED ORDER — METHOCARBAMOL 500 MG PO TABS: 500.0000 mg | ORAL_TABLET | Freq: Three times a day (TID) | ORAL | Status: AC | PRN

## 2010-08-27 NOTE — Progress Notes (Signed)
Subjective:     Patient ID: [name], female   DOB: [birth year], 55 y.o.   MRN: [id number]    Temp(Src) 99.6 F (37.6 C) (Temporal)    HPI The patient has been doing reasonably well with her neoadjuvant chemotherapy for her right breast cancer. She had a complete clinical response and MRI response to the treatment. She has developed some pain from her abraxane. She is complaining of spasms and pain in her left upper chest, left neck, and left shoulder. She also complains of left breast pain. Her right breast has remained somewhat sore throughout this time. She also was diagnosed with a very small segmental pulmonary embolus. Review of Systems Otherwise negative x11.   Objective:   Physical Exam  Constitutional: She is oriented to person, place, and time. She appears well-developed and well-nourished. No distress.  HENT:  Head: Normocephalic and atraumatic.  Mouth/Throat: No oropharyngeal exudate.  Eyes: Conjunctivae are normal. Pupils are equal, round, and reactive to light. No scleral icterus.  Neck: Normal range of motion. Neck supple. No tracheal deviation present. No thyromegaly present.  Cardiovascular: Normal rate, regular rhythm and intact distal pulses.  Exam reveals no gallop and no friction rub.   No murmur heard. Pulmonary/Chest: Effort normal and breath sounds normal. No respiratory distress. She exhibits tenderness (l upper chest/breast/shoulder).  Abdominal: Soft. She exhibits no distension. There is no tenderness. There is no rebound and no guarding.  Musculoskeletal: Normal range of motion. She exhibits tenderness (L shoulder). She exhibits no edema.  Lymphadenopathy:    She has no cervical adenopathy.  Neurological: She is alert and oriented to person, place, and time. She has normal reflexes. Coordination normal.  Skin: Skin is warm and dry. No rash noted. She is not diaphoretic. No erythema. No pallor.  Psychiatric: She has a normal mood and affect. Her behavior is  normal. Judgment and thought content normal.       Assessment:        Plan:

## 2010-08-27 NOTE — Assessment & Plan Note (Signed)
Plan mastectomy, SLN bx, possible revision of port a cath. Muscle relaxant for L pectoral pain near port a cath.  The surgical procedure was described to the patient.  I discussed the incision type and location and whether we would need radiology involved on the day of surgery with a wire marker and/or sentinel node.      The risks and benefits of the procedure were described to the patient and he/she wishes to proceed.    We discussed the risks bleeding, infection, damage to other structures, need for further procedures/surgeries.  We discussed the risk of seroma.  The patient was advised that these are the most common complications, but that others can occur as well.  They were advised against taking aspirin or other anti-inflammatory agents/blood thinners the week before surgery.

## 2010-08-29 ENCOUNTER — Encounter (HOSPITAL_BASED_OUTPATIENT_CLINIC_OR_DEPARTMENT_OTHER): Payer: Self-pay | Admitting: Oncology

## 2010-08-29 ENCOUNTER — Other Ambulatory Visit: Payer: Self-pay | Admitting: Physician Assistant

## 2010-08-29 DIAGNOSIS — Z5112 Encounter for antineoplastic immunotherapy: Secondary | ICD-10-CM

## 2010-08-29 DIAGNOSIS — Z170 Estrogen receptor positive status [ER+]: Secondary | ICD-10-CM

## 2010-08-29 DIAGNOSIS — Z5111 Encounter for antineoplastic chemotherapy: Secondary | ICD-10-CM

## 2010-08-29 DIAGNOSIS — I2699 Other pulmonary embolism without acute cor pulmonale: Secondary | ICD-10-CM

## 2010-08-29 DIAGNOSIS — C50419 Malignant neoplasm of upper-outer quadrant of unspecified female breast: Secondary | ICD-10-CM

## 2010-08-29 LAB — CBC WITH DIFFERENTIAL/PLATELET
Basophils Absolute: 0.1 10*3/uL (ref 0.0–0.1)
Eosinophils Absolute: 0.4 10*3/uL (ref 0.0–0.5)
HCT: 35.2 % (ref 34.8–46.6)
LYMPH%: 25.1 % (ref 14.0–49.7)
MONO#: 0.6 10*3/uL (ref 0.1–0.9)
NEUT#: 5.7 10*3/uL (ref 1.5–6.5)
NEUT%: 62.5 % (ref 38.4–76.8)
Platelets: 227 10*3/uL (ref 145–400)
WBC: 9.1 10*3/uL (ref 3.9–10.3)

## 2010-09-05 ENCOUNTER — Other Ambulatory Visit: Payer: Self-pay | Admitting: Physician Assistant

## 2010-09-05 ENCOUNTER — Encounter (HOSPITAL_BASED_OUTPATIENT_CLINIC_OR_DEPARTMENT_OTHER): Payer: Self-pay | Admitting: Oncology

## 2010-09-05 DIAGNOSIS — Z170 Estrogen receptor positive status [ER+]: Secondary | ICD-10-CM

## 2010-09-05 DIAGNOSIS — Z5111 Encounter for antineoplastic chemotherapy: Secondary | ICD-10-CM

## 2010-09-05 DIAGNOSIS — C50419 Malignant neoplasm of upper-outer quadrant of unspecified female breast: Secondary | ICD-10-CM

## 2010-09-05 DIAGNOSIS — Z5112 Encounter for antineoplastic immunotherapy: Secondary | ICD-10-CM

## 2010-09-05 LAB — CBC WITH DIFFERENTIAL/PLATELET
Basophils Absolute: 0.1 10*3/uL (ref 0.0–0.1)
EOS%: 5.2 % (ref 0.0–7.0)
HCT: 34.6 % — ABNORMAL LOW (ref 34.8–46.6)
HGB: 10.9 g/dL — ABNORMAL LOW (ref 11.6–15.9)
MCH: 23.6 pg — ABNORMAL LOW (ref 25.1–34.0)
MCV: 74.9 fL — ABNORMAL LOW (ref 79.5–101.0)
MONO%: 4.5 % (ref 0.0–14.0)
NEUT%: 66.1 % (ref 38.4–76.8)
Platelets: 313 10*3/uL (ref 145–400)
lymph#: 1.7 10*3/uL (ref 0.9–3.3)

## 2010-09-05 LAB — PROTIME-INR: INR: 1 — ABNORMAL LOW (ref 2.00–3.50)

## 2010-09-05 LAB — COMPREHENSIVE METABOLIC PANEL
ALT: 16 U/L (ref 0–35)
AST: 13 U/L (ref 0–37)
Calcium: 9.5 mg/dL (ref 8.4–10.5)
Chloride: 104 mEq/L (ref 96–112)
Creatinine, Ser: 0.82 mg/dL (ref 0.50–1.10)
Potassium: 3.7 mEq/L (ref 3.5–5.3)

## 2010-09-11 ENCOUNTER — Other Ambulatory Visit (INDEPENDENT_AMBULATORY_CARE_PROVIDER_SITE_OTHER): Payer: Self-pay | Admitting: General Surgery

## 2010-09-11 ENCOUNTER — Encounter (HOSPITAL_COMMUNITY)
Admission: RE | Admit: 2010-09-11 | Discharge: 2010-09-11 | Disposition: A | Discharge status: Home / Self Care | Payer: Medicaid Other | Source: Ambulatory Visit | Attending: General Surgery | Admitting: General Surgery

## 2010-09-11 ENCOUNTER — Telehealth: Payer: Self-pay | Admitting: Cardiology

## 2010-09-11 LAB — BASIC METABOLIC PANEL
BUN: 9 mg/dL (ref 6–23)
Calcium: 9.3 mg/dL (ref 8.4–10.5)
Creatinine, Ser: 0.91 mg/dL (ref 0.50–1.10)
GFR calc Af Amer: >60 mL/min (ref >60)
GFR calc non Af Amer: >60 mL/min (ref >60)
Potassium: 4 mEq/L (ref 3.5–5.1)

## 2010-09-11 LAB — CBC
HCT: 35.2 % — ABNORMAL LOW (ref 36.0–46.0)
MCHC: 31.3 g/dL (ref 30.0–36.0)
RDW: 14.5 % (ref 11.5–15.5)

## 2010-09-11 NOTE — Telephone Encounter (Signed)
Faxed OV, EKG & Echo. To [facility] ([phone]).

## 2010-09-12 ENCOUNTER — Ambulatory Visit (HOSPITAL_COMMUNITY)
Admission: RE | Admit: 2010-09-12 | Discharge: 2010-09-13 | Disposition: A | Discharge status: Home / Self Care | Payer: Medicaid Other | Source: Ambulatory Visit | Attending: General Surgery | Admitting: General Surgery

## 2010-09-12 ENCOUNTER — Ambulatory Visit (HOSPITAL_COMMUNITY): Payer: Medicaid Other

## 2010-09-12 ENCOUNTER — Ambulatory Visit (HOSPITAL_COMMUNITY)
Admission: RE | Admit: 2010-09-12 | Discharge: 2010-09-12 | Disposition: A | Discharge status: Home / Self Care | Payer: Medicaid Other | Source: Ambulatory Visit | Attending: General Surgery | Admitting: General Surgery

## 2010-09-12 DIAGNOSIS — C50911 Malignant neoplasm of unspecified site of right female breast: Secondary | ICD-10-CM

## 2010-09-12 DIAGNOSIS — I10 Essential (primary) hypertension: Secondary | ICD-10-CM | POA: Insufficient documentation

## 2010-09-12 DIAGNOSIS — D0590 Unspecified type of carcinoma in situ of unspecified breast: Secondary | ICD-10-CM | POA: Insufficient documentation

## 2010-09-12 DIAGNOSIS — Z452 Encounter for adjustment and management of vascular access device: Secondary | ICD-10-CM

## 2010-09-12 DIAGNOSIS — C50419 Malignant neoplasm of upper-outer quadrant of unspecified female breast: Secondary | ICD-10-CM | POA: Insufficient documentation

## 2010-09-12 DIAGNOSIS — M129 Arthropathy, unspecified: Secondary | ICD-10-CM | POA: Insufficient documentation

## 2010-09-12 DIAGNOSIS — Z01812 Encounter for preprocedural laboratory examination: Secondary | ICD-10-CM | POA: Insufficient documentation

## 2010-09-12 DIAGNOSIS — K219 Gastro-esophageal reflux disease without esophagitis: Secondary | ICD-10-CM | POA: Insufficient documentation

## 2010-09-12 HISTORY — PX: MASTECTOMY: SHX3

## 2010-09-12 MED ORDER — TECHNETIUM TC 99M SULFUR COLLOID FILTERED
1.0000 | Freq: Once | INTRAVENOUS | Status: AC | PRN
  Administered 2010-09-12: 1 via INTRADERMAL

## 2010-09-13 LAB — CBC
MCH: 23.4 pg — ABNORMAL LOW (ref 26.0–34.0)
MCV: 74.9 fL — ABNORMAL LOW (ref 78.0–100.0)
Platelets: 233 10*3/uL (ref 150–400)
RBC: 4.19 MIL/uL (ref 3.87–5.11)
RDW: 14.5 % (ref 11.5–15.5)

## 2010-09-18 ENCOUNTER — Telehealth (INDEPENDENT_AMBULATORY_CARE_PROVIDER_SITE_OTHER): Payer: Self-pay | Admitting: General Surgery

## 2010-09-18 NOTE — Telephone Encounter (Signed)
Discussed pathology with daughter, Good.

## 2010-09-19 ENCOUNTER — Encounter (INDEPENDENT_AMBULATORY_CARE_PROVIDER_SITE_OTHER): Payer: Self-pay | Admitting: General Surgery

## 2010-09-19 ENCOUNTER — Ambulatory Visit (INDEPENDENT_AMBULATORY_CARE_PROVIDER_SITE_OTHER): Payer: Self-pay | Admitting: General Surgery

## 2010-09-19 VITALS — Temp 99.3°F

## 2010-09-19 DIAGNOSIS — C50911 Malignant neoplasm of unspecified site of right female breast: Secondary | ICD-10-CM

## 2010-09-19 DIAGNOSIS — C50919 Malignant neoplasm of unspecified site of unspecified female breast: Secondary | ICD-10-CM

## 2010-09-19 MED ORDER — OXYCODONE HCL 5 MG PO CAPS: ORAL_CAPSULE | ORAL | Status: DC

## 2010-09-19 NOTE — Progress Notes (Signed)
[name] is a 55 y.o. female.    Chief Complaint  Patient presents with  . Other    1 st po mast     HPI HPI S/p R mastectomy, SLN bx 09/12/2010.  She is having significant pain at the R pectoralis muscle insertion site.  She denies nausea and vomiting.  She has been taking 2 pain pills every 4 hours as directed.  She continues to have significant muscles spasm.    Past Medical History  Diagnosis Date  . Cancer     No past surgical history on file.  No family history on file.  Social History History  Substance Use Topics  . Smoking status: Never Smoker   . Smokeless tobacco: Not on file  . Alcohol Use: No    Allergies  Allergen Reactions  . Asa Buff (Mag (Aspirin Buffered) Nausea And Vomiting    Current Outpatient Prescriptions  Medication Sig Dispense Refill  . fish oil-omega-3 fatty acids 1000 MG capsule Take 1 g by mouth daily.        Marland Kitchen LORazepam (ATIVAN) 0.5 MG tablet Take 0.5 mg by mouth every 8 (eight) hours.        . ranitidine (ZANTAC) 150 MG tablet Take 150 mg by mouth 2 (two) times daily.        Marland Kitchen amLODipine (NORVASC) 10 MG tablet Take 10 mg by mouth daily.        Marland Kitchen gabapentin (NEURONTIN) 100 MG capsule Take 100 mg by mouth 3 (three) times daily.        Marland Kitchen oxycodone (OXYIR) 5 MG capsule 1-4 tabs po q 4 hours as needed for pain.  50 capsule  0  . prochlorperazine (COMPAZINE) 10 MG tablet Take 10 mg by mouth every 6 (six) hours as needed.        . warfarin (COUMADIN) 2 MG tablet Take 2 mg by mouth daily.          Review of Systems Review of Systems  All other systems reviewed and are negative.    Physical Exam Physical Exam  The mastectomy incision is without erythema.  The flaps are flat except at the pectoralis insertion.  There is some swelling there.  The drain output was 20 yesterday, and only 5 so far today.  The drain is removed   Temperature 99.3 F (37.4 C).  Assessment/Plan Change to oxycodone IR.   Follow up in 1  week.   Richelle Glick 09/19/2010, 2:41 PM

## 2010-09-24 ENCOUNTER — Telehealth (INDEPENDENT_AMBULATORY_CARE_PROVIDER_SITE_OTHER): Payer: Self-pay

## 2010-09-24 NOTE — Telephone Encounter (Signed)
Daughter called to report mother is passing gas, but has not has bm in 4 days.  Advised to take 3-4 tbs mom as directed on bottle. Has been taking otc stool softener.  Advised to call back if no improvement by tomorrow.  Patient is flautus. Follow up appointment is set for 09/30/2010.

## 2010-09-24 NOTE — Op Note (Signed)
[name], [name]             ACCOUNT NO.:  [id number]  MEDICAL RECORD NO.:  [id number]  LOCATION:  5120                         FACILITY:  [facility]  PHYSICIAN:  Almond Lint, MD       DATE OF BIRTH:  1956-02-13  DATE OF PROCEDURE:  09/12/2010 DATE OF DISCHARGE:                              OPERATIVE REPORT   PREOPERATIVE DIAGNOSIS:  Clinical T3, N0, M0 right breast cancer.  POSTOPERATIVE DIAGNOSIS:  Clinical T3, N0, M0 right breast cancer.  PROCEDURE PERFORMED:  Revision of left subclavian Port-A-Cath, right mastectomy, right sentinel lymph node mapping, and right sentinel lymph node biopsy.  SURGEON:  Almond Lint, MD  ANESTHESIA:  General and local.  FINDINGS:  Three sentinel lymph node, hot, not blue.  Counts 512, 27, and 233 with background count of 5.  No clinical evidence of breast mass.  SPECIMEN:  Right breast and right axillary sentinel lymph nodes x3 to Pathology.  ESTIMATED BLOOD LOSS:  25 mL.  COMPLICATIONS:  None known.  DESCRIPTION OF PROCEDURE:  Ms. Paul was identified in the holding area and taken to operating room where she was placed on the operating room table.  General anesthesia was induced.  Her upper chest and neck as well as her upper right arm were prepped and draped in a sterile fashion.  Shoulder roll was placed for the Port-A-Cath portion.  After the time-out was performed and everything was correct, we continued. The prior incision was opened from her left subclavian Port-A-Cath.  The port was relocated lower on her chest.  This was done by elevating the breast tissue inferiorly and creating a much deeper flap.  The length of the catheter was checked to make sure that it was still in the SVC. This was secured down to the pectoralis with 2-0 Prolene.  This was flushed with diluted heparin flush followed by concentrated flush.  The skin was closed with 3-0 Vicryl interrupted deep dermals and 4-0 Monocryl running subcuticular.  The  wound was cleaned, dried, and dressed with Dermabond.  Attention was then directed to the right breast.  The incision line was marked making sure that these were of same length.  The skin flaps were infiltrated with local anesthetic, mixed with saline.  The upper incision was made first with a #10 blade. Mastectomy hooks were used to elevate the skin and the curved Mayo scissors were used to take the flap all the way up to the clavicles superiorly, the lateral sternal border medially, the inframammary fold inferiorly, and the latissimus laterally.  The perforating vessels medially were clipped to take the inferior and the lateral flap.  The inferior incision was made after administration of local mixed with the saline.  The breast was then taken off the pectoralis including the pectoralis fascia.  This was done with the cautery.  Once the inferior border of the pectoralis was identified, the sentinel lymph nodes were located with the NeoProbe.  The fatty tissue was elevated with the DeBakey forceps and tonsil was used to locate the lymph nodes.  These were very small and seemed to be fatty replaced.  The lymph nodes were elevated with an Allis and then the lymphovascular channels going  into the lymph nodes were clipped.  This was then taken off with cautery. Three lymph nodes were found in this fashion.  Once the background count was less than 10% of the highest lymph node, the breast was taken off the remainder of the way.  Several of the perforating vessels from underneath the pectoralis were also clipped.  Once the breast was off, the axillary tail was marked.  The flaps were then inspected for hemostasis and the muscle and the flaps were irrigated with 2 liters of saline.  The skin was then reapproximated using 3-0 Vicryl interrupted deep dermal sutures and 4-0 Monocryl running subcuticular sutures.  The skin was then cleaned, dried and dressed with numerous Steri-Strips, gauze, ABDs  and a breast binder.  The patient was awakened from anesthesia and taken to PACU in stable condition.  Needle, sponge and instrument counts were correct x2.     Almond Lint, MD     FB/MEDQ  D:  09/12/2010  T:  09/13/2010  Job:  [id number]  Electronically Signed by Almond Lint MD on 09/24/2010 02:19:29 PM

## 2010-09-25 ENCOUNTER — Encounter (INDEPENDENT_AMBULATORY_CARE_PROVIDER_SITE_OTHER): Payer: Self-pay | Admitting: General Surgery

## 2010-09-25 ENCOUNTER — Telehealth (INDEPENDENT_AMBULATORY_CARE_PROVIDER_SITE_OTHER): Payer: Self-pay

## 2010-09-25 NOTE — Telephone Encounter (Signed)
No answer when patient/ daughter was called back. However: Ok per Dr. Jamey Ripa to  take the neurontin for pain relief.  If rash or pain does not improve call back.

## 2010-09-25 NOTE — Telephone Encounter (Signed)
Daughter "Good'' called to state patient has had a BM, and still voiding. Pain level 5- taking oxycodone and tylenol between doses- surgical site looks ok- however she has a rash, red spots, and itching on the thigh areas (no SHOB).  I informed daughter that it may possible the oxycodone causing the rash.  She wants to know if it ok to use the neurontin 100 mg tid for pain relief. RMP

## 2010-09-25 NOTE — Telephone Encounter (Signed)
Left message for call back. Called to check to see if there has been any improvement with bm's. RMP

## 2010-09-26 ENCOUNTER — Encounter (HOSPITAL_BASED_OUTPATIENT_CLINIC_OR_DEPARTMENT_OTHER): Payer: Self-pay | Admitting: Oncology

## 2010-09-26 ENCOUNTER — Other Ambulatory Visit: Payer: Self-pay | Admitting: Physician Assistant

## 2010-09-26 DIAGNOSIS — Z170 Estrogen receptor positive status [ER+]: Secondary | ICD-10-CM

## 2010-09-26 DIAGNOSIS — C50419 Malignant neoplasm of upper-outer quadrant of unspecified female breast: Secondary | ICD-10-CM

## 2010-09-26 DIAGNOSIS — Z5111 Encounter for antineoplastic chemotherapy: Secondary | ICD-10-CM

## 2010-09-26 LAB — CBC WITH DIFFERENTIAL/PLATELET
BASO%: 0.5 % (ref 0.0–2.0)
EOS%: 4 % (ref 0.0–7.0)
HCT: 32 % — ABNORMAL LOW (ref 34.8–46.6)
MCH: 23.5 pg — ABNORMAL LOW (ref 25.1–34.0)
MCHC: 31.6 g/dL (ref 31.5–36.0)
NEUT%: 66.4 % (ref 38.4–76.8)
lymph#: 2 10*3/uL (ref 0.9–3.3)

## 2010-09-30 ENCOUNTER — Encounter (INDEPENDENT_AMBULATORY_CARE_PROVIDER_SITE_OTHER): Payer: Self-pay

## 2010-09-30 ENCOUNTER — Encounter (INDEPENDENT_AMBULATORY_CARE_PROVIDER_SITE_OTHER): Payer: Self-pay | Admitting: General Surgery

## 2010-09-30 ENCOUNTER — Ambulatory Visit (INDEPENDENT_AMBULATORY_CARE_PROVIDER_SITE_OTHER): Payer: Self-pay | Admitting: General Surgery

## 2010-09-30 VITALS — BP 132/80 | HR 64 | Temp 97.1°F | Ht <= 58 in | Wt 126.2 lb

## 2010-09-30 DIAGNOSIS — C50911 Malignant neoplasm of unspecified site of right female breast: Secondary | ICD-10-CM

## 2010-09-30 DIAGNOSIS — C50919 Malignant neoplasm of unspecified site of unspecified female breast: Secondary | ICD-10-CM

## 2010-09-30 MED ORDER — LORAZEPAM 1 MG PO TABS: 1.0000 mg | ORAL_TABLET | Freq: Four times a day (QID) | ORAL | Status: DC | PRN

## 2010-09-30 NOTE — Assessment & Plan Note (Signed)
Continue pain meds as needed. No sign of significant hematoma or seroma clinically Follow up in 3 months unless having issues. Increased ativan for muscle relaxant properties.

## 2010-09-30 NOTE — Progress Notes (Signed)
[name] is a 55 y.o. female.    Chief Complaint  Patient presents with  . Other    one week breast recheck    HPI HPI Doing better.  Pt is less stiff than before.  Still taking pain percocet and ibuprofen, but less breakthrough than before.  No fevers/chills.    Past Medical History  Diagnosis Date  . Cancer     History reviewed. No pertinent past surgical history.  History reviewed. No pertinent family history.  Social History History  Substance Use Topics  . Smoking status: Never Smoker   . Smokeless tobacco: Not on file  . Alcohol Use: No    Allergies  Allergen Reactions  . Asa Buff (Mag (Aspirin Buffered) Nausea And Vomiting    Current Outpatient Prescriptions  Medication Sig Dispense Refill  . amLODipine (NORVASC) 10 MG tablet Take 10 mg by mouth daily.        . fish oil-omega-3 fatty acids 1000 MG capsule Take 1 g by mouth daily.        Marland Kitchen gabapentin (NEURONTIN) 100 MG capsule Take 100 mg by mouth 3 (three) times daily.        . prochlorperazine (COMPAZINE) 10 MG tablet Take 10 mg by mouth every 6 (six) hours as needed.        . ranitidine (ZANTAC) 150 MG tablet Take 150 mg by mouth 2 (two) times daily.        Marland Kitchen LORazepam (ATIVAN) 1 MG tablet Take 1 tablet (1 mg total) by mouth every 6 (six) hours as needed for anxiety.  60 tablet  1  . oxycodone (OXYIR) 5 MG capsule 1-4 tabs po q 4 hours as needed for pain.  50 capsule  0  . warfarin (COUMADIN) 2 MG tablet Take 2 mg by mouth daily.          Review of Systems ROS  Physical Exam Physical Exam  Constitutional: She is oriented to person, place, and time. She appears well-developed and well-nourished. No distress.       Much less stiff than last visit.    Respiratory: Effort normal. She exhibits tenderness (R breast mastectomy site diffusely tender despite lack of hematoma).         Small hematoma significantly softer and smaller than last visit.  GI: Soft.  Musculoskeletal:       Improved range of  motion compared to last visit.  Able to abduct R arm 75-80 degrees.  Neurological: She is alert and oriented to person, place, and time.  Skin: Skin is warm and dry. No rash noted. No erythema. No pallor.  Psychiatric: She has a normal mood and affect. Her behavior is normal. Judgment and thought content normal.     Blood pressure 132/80, pulse 64, temperature 97.1 F (36.2 C), temperature source Temporal, height 4\' 10"  (1.473 m), weight 126 lb 3.2 oz (57.244 kg).  Assessment/Plan R breast cancer, cT3N0Mx,  Continue pain meds as needed. No sign of significant hematoma or seroma clinically Follow up in 3 months unless having issues. Increased ativan for muscle relaxant properties.       Michole Lecuyer 09/30/2010, 5:38 PM

## 2010-10-09 ENCOUNTER — Ambulatory Visit (INDEPENDENT_AMBULATORY_CARE_PROVIDER_SITE_OTHER): Payer: Self-pay | Admitting: General Surgery

## 2010-10-24 ENCOUNTER — Other Ambulatory Visit: Payer: Self-pay | Admitting: Physician Assistant

## 2010-10-24 ENCOUNTER — Encounter (HOSPITAL_BASED_OUTPATIENT_CLINIC_OR_DEPARTMENT_OTHER): Payer: Self-pay | Admitting: Oncology

## 2010-10-24 DIAGNOSIS — C50419 Malignant neoplasm of upper-outer quadrant of unspecified female breast: Secondary | ICD-10-CM

## 2010-10-24 DIAGNOSIS — Z170 Estrogen receptor positive status [ER+]: Secondary | ICD-10-CM

## 2010-10-24 DIAGNOSIS — Z5111 Encounter for antineoplastic chemotherapy: Secondary | ICD-10-CM

## 2010-10-24 DIAGNOSIS — Z5112 Encounter for antineoplastic immunotherapy: Secondary | ICD-10-CM

## 2010-10-24 LAB — CBC WITH DIFFERENTIAL/PLATELET
BASO%: 0.5 % (ref 0.0–2.0)
EOS%: 12.2 % — ABNORMAL HIGH (ref 0.0–7.0)
Eosinophils Absolute: 0.9 10*3/uL — ABNORMAL HIGH (ref 0.0–0.5)
MCH: 23.3 pg — ABNORMAL LOW (ref 25.1–34.0)
MCHC: 31.8 g/dL (ref 31.5–36.0)
MCV: 73.3 fL — ABNORMAL LOW (ref 79.5–101.0)
MONO%: 8.3 % (ref 0.0–14.0)
NEUT#: 3.6 10*3/uL (ref 1.5–6.5)
RBC: 4.54 10*6/uL (ref 3.70–5.45)
RDW: 13.7 % (ref 11.2–14.5)

## 2010-10-24 LAB — COMPREHENSIVE METABOLIC PANEL
AST: 22 U/L (ref 0–37)
Albumin: 3.7 g/dL (ref 3.5–5.2)
Alkaline Phosphatase: 62 U/L (ref 39–117)
Potassium: 3.7 mEq/L (ref 3.5–5.3)
Sodium: 141 mEq/L (ref 135–145)
Total Protein: 7.1 g/dL (ref 6.0–8.3)

## 2010-11-14 ENCOUNTER — Encounter (HOSPITAL_BASED_OUTPATIENT_CLINIC_OR_DEPARTMENT_OTHER): Payer: Self-pay | Admitting: Oncology

## 2010-11-14 ENCOUNTER — Ambulatory Visit (HOSPITAL_COMMUNITY)
Admission: RE | Admit: 2010-11-14 | Discharge: 2010-11-14 | Disposition: A | Discharge status: Home / Self Care | Payer: Self-pay | Source: Ambulatory Visit | Attending: Oncology | Admitting: Oncology

## 2010-11-14 ENCOUNTER — Other Ambulatory Visit: Payer: Self-pay | Admitting: Physician Assistant

## 2010-11-14 DIAGNOSIS — C50419 Malignant neoplasm of upper-outer quadrant of unspecified female breast: Secondary | ICD-10-CM

## 2010-11-14 DIAGNOSIS — Z5111 Encounter for antineoplastic chemotherapy: Secondary | ICD-10-CM

## 2010-11-14 DIAGNOSIS — Z09 Encounter for follow-up examination after completed treatment for conditions other than malignant neoplasm: Secondary | ICD-10-CM | POA: Insufficient documentation

## 2010-11-14 DIAGNOSIS — C50919 Malignant neoplasm of unspecified site of unspecified female breast: Secondary | ICD-10-CM | POA: Insufficient documentation

## 2010-11-14 DIAGNOSIS — Z170 Estrogen receptor positive status [ER+]: Secondary | ICD-10-CM

## 2010-11-14 DIAGNOSIS — E785 Hyperlipidemia, unspecified: Secondary | ICD-10-CM | POA: Insufficient documentation

## 2010-11-14 DIAGNOSIS — I10 Essential (primary) hypertension: Secondary | ICD-10-CM | POA: Insufficient documentation

## 2010-11-14 LAB — CBC WITH DIFFERENTIAL/PLATELET
Basophils Absolute: 0.1 10*3/uL (ref 0.0–0.1)
EOS%: 11.5 % — ABNORMAL HIGH (ref 0.0–7.0)
LYMPH%: 31.8 % (ref 14.0–49.7)
MCH: 22.8 pg — ABNORMAL LOW (ref 25.1–34.0)
MCV: 73.5 fL — ABNORMAL LOW (ref 79.5–101.0)
MONO%: 6.3 % (ref 0.0–14.0)
Platelets: 326 10*3/uL (ref 145–400)
RBC: 5.14 10*6/uL (ref 3.70–5.45)
RDW: 14.1 % (ref 11.2–14.5)
nRBC: 0 % (ref 0–0)

## 2010-11-14 LAB — COMPREHENSIVE METABOLIC PANEL
Alkaline Phosphatase: 64 U/L (ref 39–117)
Creatinine, Ser: 0.96 mg/dL (ref 0.50–1.10)
Glucose, Bld: 152 mg/dL — ABNORMAL HIGH (ref 70–99)
Sodium: 140 mEq/L (ref 135–145)
Total Bilirubin: 0.4 mg/dL (ref 0.3–1.2)
Total Protein: 7.3 g/dL (ref 6.0–8.3)

## 2010-12-02 ENCOUNTER — Other Ambulatory Visit (INDEPENDENT_AMBULATORY_CARE_PROVIDER_SITE_OTHER): Payer: Self-pay | Admitting: General Surgery

## 2010-12-05 ENCOUNTER — Other Ambulatory Visit: Payer: Self-pay | Admitting: Physician Assistant

## 2010-12-05 ENCOUNTER — Encounter (HOSPITAL_BASED_OUTPATIENT_CLINIC_OR_DEPARTMENT_OTHER): Payer: Self-pay | Admitting: Oncology

## 2010-12-05 DIAGNOSIS — Z5112 Encounter for antineoplastic immunotherapy: Secondary | ICD-10-CM

## 2010-12-05 DIAGNOSIS — Z5111 Encounter for antineoplastic chemotherapy: Secondary | ICD-10-CM

## 2010-12-05 DIAGNOSIS — K056 Periodontal disease, unspecified: Secondary | ICD-10-CM

## 2010-12-05 DIAGNOSIS — Z170 Estrogen receptor positive status [ER+]: Secondary | ICD-10-CM

## 2010-12-05 DIAGNOSIS — C50419 Malignant neoplasm of upper-outer quadrant of unspecified female breast: Secondary | ICD-10-CM

## 2010-12-05 LAB — CBC WITH DIFFERENTIAL/PLATELET
Basophils Absolute: 0 10*3/uL (ref 0.0–0.1)
Eosinophils Absolute: 0.5 10*3/uL (ref 0.0–0.5)
HCT: 37.7 % (ref 34.8–46.6)
LYMPH%: 25.6 % (ref 14.0–49.7)
MCV: 71.8 fL — ABNORMAL LOW (ref 79.5–101.0)
MONO#: 0.4 10*3/uL (ref 0.1–0.9)
MONO%: 5.4 % (ref 0.0–14.0)
NEUT#: 4.9 10*3/uL (ref 1.5–6.5)
NEUT%: 62.6 % (ref 38.4–76.8)
Platelets: 276 10*3/uL (ref 145–400)
RBC: 5.25 10*6/uL (ref 3.70–5.45)
WBC: 7.9 10*3/uL (ref 3.9–10.3)
nRBC: 0 % (ref 0–0)

## 2010-12-05 LAB — COMPREHENSIVE METABOLIC PANEL
ALT: 17 U/L (ref 0–35)
BUN: 8 mg/dL (ref 6–23)
CO2: 26 mEq/L (ref 19–32)
Calcium: 9.6 mg/dL (ref 8.4–10.5)
Chloride: 105 mEq/L (ref 96–112)
Creatinine, Ser: 0.85 mg/dL (ref 0.50–1.10)
Glucose, Bld: 108 mg/dL — ABNORMAL HIGH (ref 70–99)
Total Bilirubin: 0.5 mg/dL (ref 0.3–1.2)

## 2010-12-05 LAB — CANCER ANTIGEN 27.29: CA 27.29: 10 U/mL (ref 0–39)

## 2010-12-05 LAB — LACTATE DEHYDROGENASE: LDH: 112 U/L (ref 94–250)

## 2010-12-06 LAB — DIFFERENTIAL
Basophils Relative: 1
Eosinophils Absolute: 0.3
Eosinophils Relative: 4
Lymphs Abs: 1.1
Monocytes Relative: 5

## 2010-12-06 LAB — URINALYSIS, ROUTINE W REFLEX MICROSCOPIC
Hgb urine dipstick: NEGATIVE
Nitrite: NEGATIVE
Protein, ur: NEGATIVE
Specific Gravity, Urine: 1.008
Urobilinogen, UA: 0.2

## 2010-12-06 LAB — HEPATIC FUNCTION PANEL
Alkaline Phosphatase: 61
Bilirubin, Direct: <0.1
Total Bilirubin: 0.7

## 2010-12-06 LAB — POCT I-STAT CREATININE
Creatinine, Ser: 0.7
Operator id: 192351

## 2010-12-06 LAB — I-STAT 8, (EC8 V) (CONVERTED LAB)
Chloride: 105
Glucose, Bld: 153 — ABNORMAL HIGH
Potassium: 3.3 — ABNORMAL LOW
TCO2: 24
pCO2, Ven: 33.4 — ABNORMAL LOW
pH, Ven: 7.445 — ABNORMAL HIGH

## 2010-12-06 LAB — CBC
HCT: 39.2
MCHC: 31.6
MCV: 75 — ABNORMAL LOW
RBC: 5.23 — ABNORMAL HIGH
WBC: 7.5

## 2010-12-06 LAB — LIPASE, BLOOD: Lipase: 16

## 2010-12-26 ENCOUNTER — Other Ambulatory Visit: Payer: Self-pay | Admitting: Physician Assistant

## 2010-12-26 ENCOUNTER — Encounter (HOSPITAL_BASED_OUTPATIENT_CLINIC_OR_DEPARTMENT_OTHER): Payer: Medicaid Other | Admitting: Oncology

## 2010-12-26 DIAGNOSIS — Z170 Estrogen receptor positive status [ER+]: Secondary | ICD-10-CM

## 2010-12-26 DIAGNOSIS — C50419 Malignant neoplasm of upper-outer quadrant of unspecified female breast: Secondary | ICD-10-CM

## 2010-12-26 DIAGNOSIS — Z5111 Encounter for antineoplastic chemotherapy: Secondary | ICD-10-CM

## 2010-12-26 DIAGNOSIS — Z5112 Encounter for antineoplastic immunotherapy: Secondary | ICD-10-CM

## 2010-12-26 LAB — CBC WITH DIFFERENTIAL/PLATELET
BASO%: 1 % (ref 0.0–2.0)
EOS%: 4.9 % (ref 0.0–7.0)
MCH: 22.8 pg — ABNORMAL LOW (ref 25.1–34.0)
MCHC: 31.8 g/dL (ref 31.5–36.0)
NEUT%: 57.8 % (ref 38.4–76.8)
RBC: 4.7 10*6/uL (ref 3.70–5.45)
RDW: 14 % (ref 11.2–14.5)
WBC: 6.7 10*3/uL (ref 3.9–10.3)
lymph#: 1.9 10*3/uL (ref 0.9–3.3)
nRBC: 0 % (ref 0–0)

## 2010-12-30 ENCOUNTER — Ambulatory Visit (INDEPENDENT_AMBULATORY_CARE_PROVIDER_SITE_OTHER): Payer: Self-pay | Admitting: General Surgery

## 2010-12-30 ENCOUNTER — Encounter (INDEPENDENT_AMBULATORY_CARE_PROVIDER_SITE_OTHER): Payer: Self-pay | Admitting: General Surgery

## 2010-12-30 DIAGNOSIS — C50911 Malignant neoplasm of unspecified site of right female breast: Secondary | ICD-10-CM

## 2010-12-30 DIAGNOSIS — C50919 Malignant neoplasm of unspecified site of unspecified female breast: Secondary | ICD-10-CM

## 2010-12-30 MED ORDER — GABAPENTIN 300 MG PO CAPS: ORAL_CAPSULE | ORAL | Status: DC

## 2010-12-30 MED ORDER — LORAZEPAM 1 MG PO TABS: 1.0000 mg | ORAL_TABLET | Freq: Four times a day (QID) | ORAL | Status: DC | PRN

## 2010-12-30 NOTE — Patient Instructions (Signed)
Follow up with physical therapy.  Take lorazepam up to 4 times/day.  Take one pill of gabapentin (neurontin) with breakfast, lunch, and dinner.  Take two pills at bedtime.

## 2010-12-30 NOTE — Progress Notes (Signed)
HISTORY: Pt with some improvement in pain after R mastectomy.  She is no longer on oxycodone.  She is now on neurontin and lorazepam.  She has finished chemo other than the herceptin, which will continue until April.  She states that the discomfort is worse with each chemo treatment.  She has started on anastrazole for breast cancer prevention.  She describes the pain as at the medial and lateral edge of the mastectomy, and on the upper flap.  She also has a small area of numbness on the posterior upper arm.     PERTINENT REVIEW OF SYSTEMS: Otherwise negative times 11.     EXAM: Head: Normocephalic and atraumatic.  Eyes:  Conjunctivae are normal. Pupils are equal, round, and reactive to light. No scleral icterus.  Neck:  Normal range of motion. Neck supple. No tracheal deviation present. No thyromegaly present. No lymphadenopathy.   Resp: No respiratory distress, normal effort. Chest wall:   No sign of continued swelling.  Flaps are flat.  Pectoralis insertion much less tender.  Nearly full range of motion.   Neurological: Alert and oriented to person, place, and time. Coordination normal.  Skin: Skin is warm and dry. No rash noted. No diaphoretic. No erythema. No pallor.  Psychiatric: Normal mood and affect. Normal behavior. Judgment and thought content normal.     ASSESSMENT AND PLAN:   R breast cancer, cT3N0Mx,  Remains very sore, but significant improvement since last visit.     Will increase neurontin, refill lorazapam. Will follow up in 6 months. At that point, pt will be off herceptin, and can schedule port a cath removal.   I have not ever had a patient with this much pain after mastectomy even with large flap hematomas or infection. This is highly unusual.          Maudry Diego, MD Surgical Oncology, General & Endocrine Surgery [facility]  Pierce Crane, MD, MD Pierce Crane, MD

## 2010-12-30 NOTE — Assessment & Plan Note (Addendum)
Remains very sore, but significant improvement since last visit.     Will increase neurontin, refill lorazapam. Will follow up in 6 months. At that point, pt will be off herceptin, and can schedule port a cath removal.   I have not ever had a patient with this much pain after mastectomy even with large flap hematomas or infection. This is highly unusual.

## 2010-12-31 ENCOUNTER — Telehealth: Payer: Self-pay | Admitting: Oncology

## 2011-01-03 ENCOUNTER — Telehealth: Payer: Self-pay | Admitting: Oncology

## 2011-01-09 ENCOUNTER — Other Ambulatory Visit: Payer: Self-pay | Admitting: Oncology

## 2011-01-14 ENCOUNTER — Telehealth: Payer: Self-pay | Admitting: *Deleted

## 2011-01-14 NOTE — Telephone Encounter (Signed)
PT. HAS VOMITED THREE TIMES THIS AFTERNOON. SHE TOOK HER NAUSEA MEDICATION THIRTY MINUTES AGO. VERBAL ORDER AND READ BACK TO CHRIS SCHERER,PA- PT.'S TREATMENT WOULD NOT CAUSE PT. TO VOMIT. PT. NEEDS TO GO TO AN URGENT CARE. NOTIFIED DAUGHTER OF THE ABOVE INSTRUCTIONS. SHE VOICES UNDERSTANDING.

## 2011-01-23 ENCOUNTER — Ambulatory Visit (HOSPITAL_BASED_OUTPATIENT_CLINIC_OR_DEPARTMENT_OTHER): Payer: Medicaid Other | Admitting: Oncology

## 2011-01-23 ENCOUNTER — Ambulatory Visit: Payer: Self-pay

## 2011-01-23 ENCOUNTER — Other Ambulatory Visit (HOSPITAL_BASED_OUTPATIENT_CLINIC_OR_DEPARTMENT_OTHER): Payer: Medicaid Other | Admitting: Lab

## 2011-01-23 ENCOUNTER — Other Ambulatory Visit: Payer: Self-pay

## 2011-01-23 VITALS — BP 145/78 | HR 121 | Temp 98.1°F | Ht <= 58 in | Wt 123.7 lb

## 2011-01-23 DIAGNOSIS — C50419 Malignant neoplasm of upper-outer quadrant of unspecified female breast: Secondary | ICD-10-CM

## 2011-01-23 DIAGNOSIS — R51 Headache: Secondary | ICD-10-CM

## 2011-01-23 DIAGNOSIS — Z170 Estrogen receptor positive status [ER+]: Secondary | ICD-10-CM

## 2011-01-23 DIAGNOSIS — Z5111 Encounter for antineoplastic chemotherapy: Secondary | ICD-10-CM

## 2011-01-23 DIAGNOSIS — C50911 Malignant neoplasm of unspecified site of right female breast: Secondary | ICD-10-CM

## 2011-01-23 DIAGNOSIS — R52 Pain, unspecified: Secondary | ICD-10-CM

## 2011-01-23 LAB — COMPREHENSIVE METABOLIC PANEL
ALT: 31 U/L (ref 0–35)
AST: 30 U/L (ref 0–37)
Alkaline Phosphatase: 58 U/L (ref 39–117)
Glucose, Bld: 81 mg/dL (ref 70–99)
Sodium: 142 mEq/L (ref 135–145)
Total Bilirubin: 0.4 mg/dL (ref 0.3–1.2)
Total Protein: 7.7 g/dL (ref 6.0–8.3)

## 2011-01-23 LAB — CBC WITH DIFFERENTIAL/PLATELET
Basophils Absolute: 0.1 10*3/uL (ref 0.0–0.1)
Eosinophils Absolute: 0.8 10*3/uL — ABNORMAL HIGH (ref 0.0–0.5)
HCT: 38.1 % (ref 34.8–46.6)
HGB: 12 g/dL (ref 11.6–15.9)
LYMPH%: 34.8 % (ref 14.0–49.7)
MONO#: 0.5 10*3/uL (ref 0.1–0.9)
NEUT#: 3.5 10*3/uL (ref 1.5–6.5)
NEUT%: 47.7 % (ref 38.4–76.8)
Platelets: 295 10*3/uL (ref 145–400)
WBC: 7.3 10*3/uL (ref 3.9–10.3)

## 2011-01-23 NOTE — Progress Notes (Signed)
Hematology and Oncology Follow Up Visit  [name] [id number] Jul 13, 1955 55 y.o. 01/23/2011 11:46 AM   Principle Diagnosis: 55 yo [place] woman with er+ her2+ breast cancer, s/p neoadjuvant therapy with abraxane /herceptin x 4 cycles s/p mrm with residual T1c breast cancer, s/p xrt , and ongoing q 3w herceptin. Currently on arimidex.  Interim History:  Here with granddaughter. She continues to c/o of pain in various locations as well as headaches. She doesn't tolerate the herceptin well and is interested in stopping.  Medications: I have reviewed the patient's current medications.  Allergies:  Allergies  Allergen Reactions  . Asa Buff (Mag (Buffered Aspirin) Nausea And Vomiting    Past Medical History, Surgical history, Social history, and Family History were reviewed and updated.  Review of Systems: Constitutional:  Negative for fever, chills, night sweats, anorexia, weight loss, pain. Cardiovascular: no chest pain or dyspnea on exertion Respiratory: no cough, shortness of breath, or wheezing Neurological: no TIA or stroke symptoms Dermatological: negative ENT: negative Skin Gastrointestinal: no abdominal pain, change in bowel habits, or black or bloody stools Genito-Urinary: no dysuria, trouble voiding, or hematuria Hematological and Lymphatic: negative Breast: negative for breast lumps Musculoskeletal: positive for - joint pain Remaining ROS negative.  Physical Exam: Blood pressure 145/78, pulse 121, temperature 98.1 F (36.7 C), height 4\' 9"  (1.448 m), weight 123 lb 11.2 oz (56.11 kg). ECOG:  General appearance: alert, cooperative and appears older than stated age Head: Normocephalic, without obvious abnormality, atraumatic Neck: no adenopathy, no carotid bruit, no JVD, supple, symmetrical, trachea midline and thyroid not enlarged, symmetric, no tenderness/mass/nodules Lymph nodes: Cervical, supraclavicular, and axillary nodes normal. Cardiac :  Nl heart  sounds Pulmonary: nl breath sounds Breasts: rt mastectomy, cw exam unremarkable, lt breats nl Abdomen: nl Extremities nl Neuro:nl  Lab Results: Lab Results  Component Value Date   WBC 7.3 01/23/2011   HGB 12.0 01/23/2011   HCT 38.1 01/23/2011   MCV 72.8* 01/23/2011   PLT 295 01/23/2011     Chemistry      Component Value Date/Time   NA 141 12/05/2010 0959   NA 141 12/05/2010 0959   K 3.6 12/05/2010 0959   K 3.6 12/05/2010 0959   CL 105 12/05/2010 0959   CL 105 12/05/2010 0959   CO2 26 12/05/2010 0959   CO2 26 12/05/2010 0959   BUN 8 12/05/2010 0959   BUN 8 12/05/2010 0959   CREATININE 0.85 12/05/2010 0959   CREATININE 0.85 12/05/2010 0959      Component Value Date/Time   CALCIUM 9.6 12/05/2010 0959   CALCIUM 9.6 12/05/2010 0959   ALKPHOS 65 12/05/2010 0959   ALKPHOS 65 12/05/2010 0959   AST 19 12/05/2010 0959   AST 19 12/05/2010 0959   ALT 17 12/05/2010 0959   ALT 17 12/05/2010 0959   BILITOT 0.5 12/05/2010 0959   BILITOT 0.5 12/05/2010 0959       Radiological Studies: chest X-ray n/a Mammogram n/a Bone density n/a  Impression and Plan: Her2+ breast cancer on arimidex. She wishes to stop herceptin, not clear etiology of her pain, but will go ahead with brain mri and bone scan. Will likely remove port in 2-4 months. She will continue arimidex and arrange for mammogram in 3/13.  More than 50% of the visit was spent in patient-related counselling   Pierce Crane, MD 11/29/201211:46 AM

## 2011-02-06 ENCOUNTER — Ambulatory Visit: Payer: Self-pay

## 2011-02-06 ENCOUNTER — Other Ambulatory Visit: Payer: Self-pay | Admitting: Lab

## 2011-02-11 ENCOUNTER — Encounter (HOSPITAL_COMMUNITY)
Admission: RE | Admit: 2011-02-11 | Discharge: 2011-02-11 | Disposition: A | Discharge status: Home / Self Care | Payer: Medicaid Other | Source: Ambulatory Visit | Attending: Oncology | Admitting: Oncology

## 2011-02-11 ENCOUNTER — Other Ambulatory Visit: Payer: Self-pay | Admitting: Oncology

## 2011-02-11 DIAGNOSIS — C50911 Malignant neoplasm of unspecified site of right female breast: Secondary | ICD-10-CM

## 2011-02-11 DIAGNOSIS — R112 Nausea with vomiting, unspecified: Secondary | ICD-10-CM | POA: Insufficient documentation

## 2011-02-11 DIAGNOSIS — C50919 Malignant neoplasm of unspecified site of unspecified female breast: Secondary | ICD-10-CM | POA: Insufficient documentation

## 2011-02-11 MED ORDER — IOHEXOL 300 MG/ML  SOLN
100.0000 mL | Freq: Once | INTRAMUSCULAR | Status: AC | PRN
  Administered 2011-02-11: 100 mL via INTRAVENOUS

## 2011-02-11 MED ORDER — TECHNETIUM TC 99M MEDRONATE IV KIT
25.0000 | PACK | Freq: Once | INTRAVENOUS | Status: AC | PRN
  Administered 2011-02-11: 25 via INTRAVENOUS

## 2011-02-22 ENCOUNTER — Telehealth: Payer: Self-pay | Admitting: Oncology

## 2011-02-22 NOTE — Telephone Encounter (Signed)
Added 1/24 appt and lm for michelle to add tx after 1/4 f/u w/PR. Pt to be given new schedule when she comes in for 1/4 f/u.

## 2011-02-28 ENCOUNTER — Ambulatory Visit (HOSPITAL_BASED_OUTPATIENT_CLINIC_OR_DEPARTMENT_OTHER): Payer: Medicaid Other | Admitting: Oncology

## 2011-02-28 ENCOUNTER — Ambulatory Visit: Payer: Medicaid Other

## 2011-02-28 ENCOUNTER — Telehealth: Payer: Self-pay | Admitting: Oncology

## 2011-02-28 VITALS — BP 170/88 | HR 120 | Temp 97.9°F | Ht <= 58 in | Wt 122.5 lb

## 2011-02-28 DIAGNOSIS — C50919 Malignant neoplasm of unspecified site of unspecified female breast: Secondary | ICD-10-CM

## 2011-02-28 NOTE — Progress Notes (Signed)
Clydia is here with her g daughter.. She feels better not taking herceptin and definitely wants to stop. Her ct of the head and bone scan were both negative.I  wull contact dr Donell Beers to have the port removed.. I will see Her in 3 months. I have asked that she continue arimidex and vitamin d. Pierce Crane md

## 2011-02-28 NOTE — Telephone Encounter (Signed)
Gv pt appt for jan2013 °

## 2011-03-04 ENCOUNTER — Encounter: Payer: Self-pay | Admitting: Oncology

## 2011-03-04 ENCOUNTER — Other Ambulatory Visit (INDEPENDENT_AMBULATORY_CARE_PROVIDER_SITE_OTHER): Payer: Self-pay | Admitting: General Surgery

## 2011-03-04 NOTE — Progress Notes (Signed)
Patient received one prescription from [facility] on 03/03/11 $56.17,her remaning balance [facility] $4.99and ALIGHT $424.71.

## 2011-03-05 ENCOUNTER — Telehealth: Payer: Self-pay | Admitting: *Deleted

## 2011-03-05 ENCOUNTER — Telehealth (INDEPENDENT_AMBULATORY_CARE_PROVIDER_SITE_OTHER): Payer: Self-pay

## 2011-03-05 NOTE — Telephone Encounter (Signed)
Dtr. Calls confused b/c she thinks [facility] has called her for a bone density test for her mother.    Talked with Bernie/[facility] [phone] and she will call dtr. For PAC removal by Dr. Donell Beers per Dr. Gita Kudo office visit note from 02/28/11. Also explained to dtr. That Dr. Donnie Coffin ordered a bone denisty test to check her mom's bones simply because she is on the arimidex and that is something we will monitor while her mother is on the arimidex.  Dtr. Verbalized understanding.  She will call to schedule bone density and knows that Dr. Elita Boone office will call her to have her Mom's PAC removed.

## 2011-03-05 NOTE — Telephone Encounter (Signed)
Spoke with the pt's daughter today.  Surgery scheduling will contact her to schedule a date for [facility] removal.  Dr. Donnie Coffin has authorized and Dr. Donell Beers is aware.

## 2011-03-05 NOTE — Telephone Encounter (Signed)
Jann from Dr. Renelda Loma office called.  Dr. Donnie Coffin wants the patient scheduled for [facility] removal.  I will contact patient to schedule.

## 2011-03-06 ENCOUNTER — Other Ambulatory Visit: Payer: Self-pay | Admitting: *Deleted

## 2011-03-06 NOTE — Telephone Encounter (Signed)
Pt family member notified  this desk that she having many "loose stools" and feels that this a "side effect from arimidex" pt has been instructed that, although this is not a usual side effect, she may d/c the arimidex for 2 weeks, if sx persist she is to notify this desk. If sx subside she is to re-challange the arimidex and notify this desk

## 2011-03-07 ENCOUNTER — Encounter (HOSPITAL_COMMUNITY): Payer: Self-pay

## 2011-03-10 ENCOUNTER — Other Ambulatory Visit (INDEPENDENT_AMBULATORY_CARE_PROVIDER_SITE_OTHER): Payer: Self-pay | Admitting: General Surgery

## 2011-03-12 ENCOUNTER — Ambulatory Visit (HOSPITAL_COMMUNITY)
Admission: RE | Admit: 2011-03-12 | Discharge: 2011-03-12 | Disposition: A | Discharge status: Home / Self Care | Payer: Medicaid Other | Source: Ambulatory Visit | Attending: General Surgery | Admitting: General Surgery

## 2011-03-12 ENCOUNTER — Encounter (HOSPITAL_COMMUNITY)
Admission: RE | Admit: 2011-03-12 | Discharge: 2011-03-12 | Disposition: A | Discharge status: Home / Self Care | Payer: Medicaid Other | Source: Ambulatory Visit | Attending: General Surgery | Admitting: General Surgery

## 2011-03-12 ENCOUNTER — Encounter (HOSPITAL_COMMUNITY): Payer: Self-pay

## 2011-03-12 DIAGNOSIS — Z01818 Encounter for other preprocedural examination: Secondary | ICD-10-CM | POA: Insufficient documentation

## 2011-03-12 DIAGNOSIS — Z01812 Encounter for preprocedural laboratory examination: Secondary | ICD-10-CM | POA: Insufficient documentation

## 2011-03-12 HISTORY — DX: Anxiety disorder, unspecified: F41.9

## 2011-03-12 HISTORY — DX: Cough: R05

## 2011-03-12 HISTORY — DX: Functional dyspepsia: K30

## 2011-03-12 HISTORY — DX: Cough, unspecified: R05.9

## 2011-03-12 LAB — CBC
Hemoglobin: 10.7 g/dL — ABNORMAL LOW (ref 12.0–15.0)
MCH: 22.6 pg — ABNORMAL LOW (ref 26.0–34.0)
MCHC: 30.4 g/dL (ref 30.0–36.0)

## 2011-03-12 LAB — BASIC METABOLIC PANEL
BUN: 8 mg/dL (ref 6–23)
Calcium: 10.3 mg/dL (ref 8.4–10.5)
GFR calc non Af Amer: >90 mL/min (ref >90)
Glucose, Bld: 112 mg/dL — ABNORMAL HIGH (ref 70–99)
Sodium: 142 mEq/L (ref 135–145)

## 2011-03-12 NOTE — Pre-Procedure Instructions (Addendum)
RELEASE OF RESPONSIBILITY FOR INTERPRETATION READ TO PT--AND INTERPRETED TO PT BY HER DAUGHTER GOOD Grabert--AND PT SIGNED THE RELEASE FOR HER DAUGHTER GOOD TO INTERPRET.  GOOD WILL BE WITH PT DAY OF SURGERY TO INTERPRET. CT ANGIO OF CHEST REPORT 08/15/10, EKG REPORT 05/06/10, AND ECHOCARDIOGRAM REPORT 11/14/10  ON PT'S CHART--ALL REPORTS FROM [facility].  CXR WAS REPEATED TODAY AT [facility] PREOP BECAUSE PT REPORTS PERSISTENT COUGH FOR PAST MONTH.

## 2011-03-12 NOTE — Patient Instructions (Addendum)
20 [name]  03/12/2011   Your procedure is scheduled on:  WED 1/23  AT 12:00 PM  Report to [facility] at 10:00 AM.  Call this number if you have problems the morning of surgery: [phone]   Remember: STOP FISH OIL--NO HERBALS OR ASPIRIN FOR 7 DAYS BEFORE YOUR SURGERY   Do not eat food OR DRINK ANYTHING AFTER MIDNIGHT THE NIGHT BEFORE YOUR SURGERY.    Take these medicines the morning of surgery with A SIP OF WATER:GABAPENTIN, ZANTAC, TRAMADOL- IF NEEDED FOR PAIN     Do not wear jewelry, make-up or nail polish.  Do not wear lotions, powders, or perfumes. You may wear deodorant.  Do not shave 48 hours prior to surgery.  Do not bring valuables to the hospital.  Contacts, dentures or bridgework may not be worn into surgery.  Leave suitcase in the car. After surgery it may be brought to your room.  For patients admitted to the hospital, checkout time is 11:00 AM the day of discharge.   Patients discharged the day of surgery will not be allowed to drive home.  Name and phone number of your driver: DAUGHTER  Special Instructions: [facility] Shower Use Special Wash: 1/2 bottle night before surgery and 1/2 bottle morning of surgery.   Please read over the following fact sheets that you were given: MRSA Information

## 2011-03-19 ENCOUNTER — Encounter (HOSPITAL_COMMUNITY): Payer: Self-pay | Admitting: Certified Registered Nurse Anesthetist

## 2011-03-19 ENCOUNTER — Ambulatory Visit (HOSPITAL_COMMUNITY): Payer: Medicaid Other | Admitting: Certified Registered Nurse Anesthetist

## 2011-03-19 ENCOUNTER — Encounter (HOSPITAL_COMMUNITY)
Admission: RE | Disposition: A | Discharge status: Home / Self Care | Payer: Self-pay | Source: Ambulatory Visit | Attending: General Surgery

## 2011-03-19 ENCOUNTER — Ambulatory Visit (HOSPITAL_COMMUNITY)
Admission: RE | Admit: 2011-03-19 | Discharge: 2011-03-19 | Disposition: A | Discharge status: Home / Self Care | Payer: Medicaid Other | Source: Ambulatory Visit | Attending: General Surgery | Admitting: General Surgery

## 2011-03-19 ENCOUNTER — Encounter (HOSPITAL_COMMUNITY): Payer: Self-pay | Admitting: *Deleted

## 2011-03-19 DIAGNOSIS — R059 Cough, unspecified: Secondary | ICD-10-CM | POA: Insufficient documentation

## 2011-03-19 DIAGNOSIS — I10 Essential (primary) hypertension: Secondary | ICD-10-CM | POA: Insufficient documentation

## 2011-03-19 DIAGNOSIS — Z452 Encounter for adjustment and management of vascular access device: Secondary | ICD-10-CM

## 2011-03-19 DIAGNOSIS — R1013 Epigastric pain: Secondary | ICD-10-CM | POA: Insufficient documentation

## 2011-03-19 DIAGNOSIS — Z79899 Other long term (current) drug therapy: Secondary | ICD-10-CM | POA: Insufficient documentation

## 2011-03-19 DIAGNOSIS — E785 Hyperlipidemia, unspecified: Secondary | ICD-10-CM | POA: Insufficient documentation

## 2011-03-19 DIAGNOSIS — C50919 Malignant neoplasm of unspecified site of unspecified female breast: Secondary | ICD-10-CM | POA: Insufficient documentation

## 2011-03-19 DIAGNOSIS — K3189 Other diseases of stomach and duodenum: Secondary | ICD-10-CM | POA: Insufficient documentation

## 2011-03-19 DIAGNOSIS — R05 Cough: Secondary | ICD-10-CM | POA: Insufficient documentation

## 2011-03-19 HISTORY — PX: PORT-A-CATH REMOVAL: SHX5289

## 2011-03-19 SURGERY — REMOVAL PORT-A-CATH
Anesthesia: General | Site: Chest | Wound class: Clean

## 2011-03-19 MED ORDER — SODIUM CHLORIDE 0.9 % IJ SOLN: 3.0000 mL | Freq: Two times a day (BID) | INTRAMUSCULAR | Status: DC

## 2011-03-19 MED ORDER — FENTANYL CITRATE 0.05 MG/ML IJ SOLN: 25.0000 ug | INTRAMUSCULAR | Status: DC | PRN

## 2011-03-19 MED ORDER — BUPIVACAINE LIPOSOME 1.3 % IJ SUSP
20.0000 mL | INTRAMUSCULAR | Status: DC
  Filled 2011-03-19: qty 20

## 2011-03-19 MED ORDER — MIDAZOLAM HCL 5 MG/5ML IJ SOLN
INTRAMUSCULAR | Status: DC | PRN
  Administered 2011-03-19: 2 mg via INTRAVENOUS

## 2011-03-19 MED ORDER — LABETALOL HCL 5 MG/ML IV SOLN
5.0000 mg | Freq: Once | INTRAVENOUS | Status: AC
  Administered 2011-03-19: 5 mg via INTRAVENOUS
  Filled 2011-03-19: qty 4

## 2011-03-19 MED ORDER — LIDOCAINE HCL 1 % IJ SOLN
INTRAMUSCULAR | Status: AC
  Filled 2011-03-19: qty 20

## 2011-03-19 MED ORDER — ACETAMINOPHEN 325 MG PO TABS: 650.0000 mg | ORAL_TABLET | ORAL | Status: DC | PRN

## 2011-03-19 MED ORDER — OXYCODONE HCL 5 MG PO TABS: 5.0000 mg | ORAL_TABLET | ORAL | Status: DC | PRN

## 2011-03-19 MED ORDER — PROMETHAZINE HCL 25 MG/ML IJ SOLN: 6.2500 mg | INTRAMUSCULAR | Status: DC | PRN

## 2011-03-19 MED ORDER — LIDOCAINE HCL (CARDIAC) 20 MG/ML IV SOLN
INTRAVENOUS | Status: DC | PRN
  Administered 2011-03-19: 60 mg via INTRAVENOUS

## 2011-03-19 MED ORDER — BUPIVACAINE LIPOSOME 1.3 % IJ SUSP
INTRAMUSCULAR | Status: DC | PRN
  Administered 2011-03-19: 20 mL

## 2011-03-19 MED ORDER — ONDANSETRON HCL 4 MG/2ML IJ SOLN: 4.0000 mg | Freq: Four times a day (QID) | INTRAMUSCULAR | Status: DC | PRN

## 2011-03-19 MED ORDER — BUPIVACAINE-EPINEPHRINE PF 0.25-1:200000 % IJ SOLN
INTRAMUSCULAR | Status: AC
  Filled 2011-03-19: qty 30

## 2011-03-19 MED ORDER — SODIUM CHLORIDE 0.9 % IV SOLN: 250.0000 mL | INTRAVENOUS | Status: DC | PRN

## 2011-03-19 MED ORDER — CEFAZOLIN SODIUM 1-5 GM-% IV SOLN
1.0000 g | INTRAVENOUS | Status: AC
  Administered 2011-03-19: 1 g via INTRAVENOUS

## 2011-03-19 MED ORDER — FENTANYL CITRATE 0.05 MG/ML IJ SOLN
INTRAMUSCULAR | Status: DC | PRN
  Administered 2011-03-19: 100 ug via INTRAVENOUS
  Administered 2011-03-19: 50 ug via INTRAVENOUS

## 2011-03-19 MED ORDER — PROMETHAZINE HCL 25 MG/ML IJ SOLN: 12.5000 mg | Freq: Four times a day (QID) | INTRAMUSCULAR | Status: DC | PRN

## 2011-03-19 MED ORDER — SODIUM CHLORIDE 0.9 % IJ SOLN: 3.0000 mL | INTRAMUSCULAR | Status: DC | PRN

## 2011-03-19 MED ORDER — LACTATED RINGERS IV SOLN
INTRAVENOUS | Status: DC
  Administered 2011-03-19: 1000 mL via INTRAVENOUS

## 2011-03-19 MED ORDER — CEFAZOLIN SODIUM 1-5 GM-% IV SOLN
INTRAVENOUS | Status: AC
  Filled 2011-03-19: qty 50

## 2011-03-19 MED ORDER — ACETAMINOPHEN 650 MG RE SUPP
650.0000 mg | RECTAL | Status: DC | PRN
  Filled 2011-03-19: qty 1

## 2011-03-19 MED ORDER — 0.9 % SODIUM CHLORIDE (POUR BTL) OPTIME
TOPICAL | Status: DC | PRN
  Administered 2011-03-19: 1000 mL

## 2011-03-19 MED ORDER — PROPOFOL 10 MG/ML IV BOLUS
INTRAVENOUS | Status: DC | PRN
  Administered 2011-03-19: 150 mg via INTRAVENOUS

## 2011-03-19 MED ORDER — OXYCODONE-ACETAMINOPHEN 5-325 MG PO TABS: 1.0000 | ORAL_TABLET | ORAL | Status: AC | PRN

## 2011-03-19 SURGICAL SUPPLY — 35 items
BENZOIN TINCTURE PRP APPL 2/3 (GAUZE/BANDAGES/DRESSINGS) IMPLANT
BLADE SURG 15 STRL LF DISP TIS (BLADE) ×1 IMPLANT
BLADE SURG 15 STRL SS (BLADE) ×1
CANISTER SUCTION 2500CC (MISCELLANEOUS) ×2 IMPLANT
CHLORAPREP W/TINT 10.5 ML (MISCELLANEOUS) ×2 IMPLANT
CLOTH BEACON ORANGE TIMEOUT ST (SAFETY) ×2 IMPLANT
DECANTER SPIKE VIAL GLASS SM (MISCELLANEOUS) ×2 IMPLANT
DERMABOND ADVANCED (GAUZE/BANDAGES/DRESSINGS) ×2
DERMABOND ADVANCED .7 DNX12 (GAUZE/BANDAGES/DRESSINGS) ×2 IMPLANT
DRAPE LAPAROTOMY TRNSV 102X78 (DRAPE) IMPLANT
ELECT COATED BLADE 2.86 ST (ELECTRODE) IMPLANT
ELECT REM PT RETURN 9FT ADLT (ELECTROSURGICAL) ×2
ELECTRODE REM PT RTRN 9FT ADLT (ELECTROSURGICAL) ×1 IMPLANT
GAUZE SPONGE 4X4 16PLY XRAY LF (GAUZE/BANDAGES/DRESSINGS) IMPLANT
GLOVE BIO SURGEON STRL SZ 6 (GLOVE) ×2 IMPLANT
GLOVE BIO SURGEON STRL SZ 6.5 (GLOVE) IMPLANT
GLOVE BIOGEL PI IND STRL 6.5 (GLOVE) ×1 IMPLANT
GLOVE BIOGEL PI IND STRL 7.0 (GLOVE) ×1 IMPLANT
GLOVE BIOGEL PI INDICATOR 6.5 (GLOVE) ×1
GLOVE BIOGEL PI INDICATOR 7.0 (GLOVE) ×1
GLOVE INDICATOR 6.5 STRL GRN (GLOVE) ×4 IMPLANT
GOWN PREVENTION PLUS XXLARGE (GOWN DISPOSABLE) ×2 IMPLANT
KIT BASIN OR (CUSTOM PROCEDURE TRAY) ×2 IMPLANT
NEEDLE HYPO 22GX1.5 SAFETY (NEEDLE) IMPLANT
NEEDLE HYPO 25X1 1.5 SAFETY (NEEDLE) ×2 IMPLANT
PACK BASIC VI WITH GOWN DISP (CUSTOM PROCEDURE TRAY) ×2 IMPLANT
PEN SKIN MARKING BROAD (MISCELLANEOUS) ×2 IMPLANT
PENCIL BUTTON HOLSTER BLD 10FT (ELECTRODE) ×2 IMPLANT
SPONGE GAUZE 4X4 12PLY (GAUZE/BANDAGES/DRESSINGS) ×2 IMPLANT
STRIP CLOSURE SKIN 1/2X4 (GAUZE/BANDAGES/DRESSINGS) IMPLANT
SUT MNCRL AB 4-0 PS2 18 (SUTURE) ×2 IMPLANT
SUT VIC AB 3-0 SH 27 (SUTURE) ×1
SUT VIC AB 3-0 SH 27X BRD (SUTURE) ×1 IMPLANT
SYR CONTROL 10ML LL (SYRINGE) ×2 IMPLANT
TOWEL OR 17X26 10 PK STRL BLUE (TOWEL DISPOSABLE) ×2 IMPLANT

## 2011-03-19 NOTE — Op Note (Signed)
  PRE-OPERATIVE DIAGNOSIS:  un-needed Port-A-Cath for Right breast cancer   POST-OPERATIVE DIAGNOSIS:  Same   PROCEDURE:  Procedure(s):  REMOVAL Left subclavian PORT-A-CATH  SURGEON:  Surgeon(s):  Almond Lint, MD  ANESTHESIA:   MAC+ local  EBL:   Minimal  SPECIMEN:  None  Complications : none known  Procedure:   Pt was  identified in the holding area and taken to the operating room where she was placed supine on the operating room table.  MAC anesthesia was induced.  The L upper chest was prepped and draped.  The prior incision was anesthetized with local anesthetic (Exparel).  The incision was opened with a #15 blade.  The subcutaneous tissue was divided with the cautery.  The port was identified and the capsule opened.  The 4 2-0 prolene sutures were removed.  The port was then removed and pressure held on the tract.  The catheter appeared intact without evidence of breakage.  The wound was inspected for hemostasis, which was achieved with cautery.  The wound was closed with 3-0 vicryl deep dermal interrupted sutures and 4-0 Monocryl running subcuticular suture.  The wound was cleaned, dried, and dressed with dermabond.  The patient was awakened from anesthesia and taken to the PACU in stable condition.  Needle, sponge, and instrument counts are correct.

## 2011-03-19 NOTE — H&P (Signed)
HISTORY:  Pt with some improvement in pain after R mastectomy. Pt still on tramadol and benzodiazepines.  She is doing better overall.  She has had difficulty with Herceptin, and she is stopping the course early.  The patient requests port removal.    Past Medical History  Diagnosis Date  . Hyperlipidemia   . Hypertension   . Cough     PT HAS HAD COUGH FOR PAST MONTH-WHITE PHLEGM  . Cancer     breast right side -COMPLETED CHEMO WITH DR. Theron Arista RUBIN--EXCEPT ARIMIDEX  . Indigestion     TAKING ZANTAC  . Anxiety     ATIVAN HELPS PT TO SLEEP   Past Surgical History  Procedure Date  . Mastectomy 09/12/10    right  . Portacath placement   . Uti recent    pt has been taking an antibiotic for recent UTI   Allergies  Allergen Reactions  . Asa Buff (Mag (Buffered Aspirin) Nausea And Vomiting   History   Social History  . Marital Status: Single    Spouse Name: N/A    Number of Children: N/A  . Years of Education: N/A   Social History Main Topics  . Smoking status: Never Smoker   . Smokeless tobacco: Not on file  . Alcohol Use: No  . Drug Use: No  . Sexually Active: Not on file   Medication List  As of 03/19/2011 11:39 AM   ASK your doctor about these medications         ALKA-SELTZER PLUS COLD & COUGH PO   Take by mouth. TAKING EVERY 4 HOURS FOR PERSISTENT COUGH      anastrozole 1 MG tablet   Commonly known as: ARIMIDEX   Take 1 mg by mouth daily. HAVING URINARY FREQUENCY--STOPPED ARIMIDEX FRI 03/07/11--STOPPING FOR 2 WEEKS--THEN POSSIBLE RESTARTING THE MEDICATION      CLEAR EYES OP   Apply 1 drop to eye as needed. For dry/irritated eyes      FIBER COMPLETE PO   Take 2 capsules by mouth daily.      fish oil-omega-3 fatty acids 1000 MG capsule   Take 1 g by mouth daily.      gabapentin 300 MG capsule   Commonly known as: NEURONTIN   Take 300 mg by mouth as needed. For pain.  USUALLY TAKING ONE EVERY 6 HOURS--FOR PAIN IN HER UPPER BACK AND LEFT SHOULDER (AND PORT A  CATH DISCOMFORT)      ibuprofen 200 MG tablet   Commonly known as: ADVIL,MOTRIN   Take 400 mg by mouth every 4 (four) hours as needed. For pain.      lisinopril 10 MG tablet   Commonly known as: PRINIVIL,ZESTRIL   Take 10 mg by mouth every morning.      LORazepam 1 MG tablet   Commonly known as: ATIVAN   Take 1 mg by mouth at bedtime.      mulitivitamin with minerals Tabs   Take 2 tablets by mouth daily.      pravastatin 40 MG tablet   Commonly known as: PRAVACHOL   Take 40 mg by mouth every evening.      ranitidine 150 MG tablet   Commonly known as: ZANTAC   Take 150 mg by mouth daily before breakfast.      traMADol 50 MG tablet   Commonly known as: ULTRAM   Take 50 mg by mouth every 8 (eight) hours as needed. For pain.      VITAMIN D PO  Take 1 tablet by mouth daily.            PERTINENT REVIEW OF SYSTEMS:  Otherwise negative times 11.   Filed Vitals:   03/19/11 0948  BP: 144/89  Pulse: 130  Temp: 97 F (36.1 C)  Resp: 20    EXAM:  Head: Normocephalic and atraumatic.  Eyes: Conjunctivae are normal. Pupils are equal, round, and reactive to light. No scleral icterus.  Neck: Normal range of motion. Neck supple. No tracheal deviation present.  Resp: No respiratory distress, normal effort.  Chest wall: Port a cath site non inflamed, non tender. Neurological: Alert and oriented to person, place, and time. Coordination normal.  Skin: Skin is warm and dry. No rash noted. No diaphoretic. No erythema. No pallor.  Psychiatric: Normal mood and affect. Normal behavior. Judgment and thought content normal.    ASSESSMENT AND PLAN:  R breast cancer, cT3N0Mx Remove port a cath. Reviewed risks and benefits of surgery with patient and daughter.     Maudry Diego, MD  Surgical Oncology, General & Endocrine Surgery  [facility]  Pierce Crane, MD, MD  Pierce Crane, MD

## 2011-03-19 NOTE — Addendum Note (Signed)
Addendum  created 03/19/11 1342 by Azell Der, MD   Modules edited:Orders

## 2011-03-19 NOTE — Anesthesia Preprocedure Evaluation (Addendum)
Anesthesia Evaluation  Patient identified by MRN, date of birth, ID band Patient awake    Reviewed: Allergy & Precautions, H&P , NPO status , Patient's Chart, lab work & pertinent test results  Airway Mallampati: II TM Distance: >3 FB Neck ROM: Full    Dental   Loose left lower canine.:   Pulmonary neg pulmonary ROS,  clear to auscultation  Pulmonary exam normal       Cardiovascular hypertension, Pt. on medications Regular Normal    Neuro/Psych Anxiety Negative Neurological ROS  Negative Psych ROS   GI/Hepatic negative GI ROS, Neg liver ROS,   Endo/Other  Negative Endocrine ROS  Renal/GU negative Renal ROS  Genitourinary negative   Musculoskeletal negative musculoskeletal ROS (+)   Abdominal   Peds negative pediatric ROS (+)  Hematology negative hematology ROS (+)   Anesthesia Other Findings Discussed r/b through interpreter (daughter)  Reproductive/Obstetrics negative OB ROS                          Anesthesia Physical Anesthesia Plan  ASA: II  Anesthesia Plan: General   Post-op Pain Management:    Induction: Intravenous  Airway Management Planned: LMA  Additional Equipment:   Intra-op Plan:   Post-operative Plan: Extubation in OR  Informed Consent: I have reviewed the patients History and Physical, chart, labs and discussed the procedure including the risks, benefits and alternatives for the proposed anesthesia with the patient or authorized representative who has indicated his/her understanding and acceptance.   Dental advisory given  Plan Discussed with: CRNA  Anesthesia Plan Comments:         Anesthesia Quick Evaluation

## 2011-03-19 NOTE — Anesthesia Procedure Notes (Signed)
Procedure Name: MAC Date/Time: 03/19/2011 12:30 PM Performed by: Hulan Fess Pre-anesthesia Checklist: Patient identified, Emergency Drugs available, Suction available, Patient being monitored and Timeout performed Patient Re-evaluated:Patient Re-evaluated prior to inductionOxygen Delivery Method: Circle System Utilized Preoxygenation: Pre-oxygenation with 100% oxygen Intubation Type: IV induction Ventilation: Mask ventilation without difficulty and Mask ventilation throughout procedure Dental Injury: Teeth and Oropharynx as per pre-operative assessment

## 2011-03-19 NOTE — Transfer of Care (Signed)
Immediate Anesthesia Transfer of Care Note  Patient: [name]  Procedure(s) Performed:  REMOVAL PORT-A-CATH  Patient Location: PACU  Anesthesia Type: General  Level of Consciousness: awake and sedated  Airway & Oxygen Therapy: Patient connected to face mask oxygen  Post-op Assessment: Report given to PACU RN  Post vital signs: Reviewed and stable  Complications: No apparent anesthesia complications

## 2011-03-19 NOTE — Anesthesia Postprocedure Evaluation (Signed)
  Anesthesia Post-op Note  Patient: Horticulturist, commercial  Procedure(s) Performed:  REMOVAL PORT-A-CATH  Patient Location: PACU  Anesthesia Type: General  Level of Consciousness: awake and alert   Airway and Oxygen Therapy: Patient Spontanous Breathing  Post-op Pain: mild  Post-op Assessment: Post-op Vital signs reviewed, Patient's Cardiovascular Status Stable, Respiratory Function Stable, Patent Airway and No signs of Nausea or vomiting  Post-op Vital Signs: stable  Complications: No apparent anesthesia complications

## 2011-03-20 ENCOUNTER — Ambulatory Visit: Payer: Medicaid Other

## 2011-03-21 ENCOUNTER — Telehealth (INDEPENDENT_AMBULATORY_CARE_PROVIDER_SITE_OTHER): Payer: Self-pay

## 2011-03-21 ENCOUNTER — Encounter (HOSPITAL_COMMUNITY): Payer: Self-pay | Admitting: General Surgery

## 2011-03-21 NOTE — Telephone Encounter (Signed)
LMOM pt has post op appt on 2/18 with Dr. Donell Beers.

## 2011-03-24 ENCOUNTER — Ambulatory Visit
Admission: RE | Admit: 2011-03-24 | Discharge: 2011-03-24 | Disposition: A | Discharge status: Home / Self Care | Payer: Medicaid Other | Source: Ambulatory Visit | Attending: Oncology | Admitting: Oncology

## 2011-03-24 DIAGNOSIS — C50919 Malignant neoplasm of unspecified site of unspecified female breast: Secondary | ICD-10-CM

## 2011-04-14 ENCOUNTER — Ambulatory Visit (INDEPENDENT_AMBULATORY_CARE_PROVIDER_SITE_OTHER): Payer: Medicaid Other | Admitting: General Surgery

## 2011-04-14 ENCOUNTER — Other Ambulatory Visit (INDEPENDENT_AMBULATORY_CARE_PROVIDER_SITE_OTHER): Payer: Self-pay | Admitting: General Surgery

## 2011-04-14 ENCOUNTER — Encounter (INDEPENDENT_AMBULATORY_CARE_PROVIDER_SITE_OTHER): Payer: Self-pay | Admitting: General Surgery

## 2011-04-14 VITALS — BP 110/76 | HR 66 | Temp 97.2°F | Resp 16 | Ht <= 58 in | Wt 126.0 lb

## 2011-04-14 DIAGNOSIS — C50919 Malignant neoplasm of unspecified site of unspecified female breast: Secondary | ICD-10-CM

## 2011-04-14 DIAGNOSIS — Z1231 Encounter for screening mammogram for malignant neoplasm of breast: Secondary | ICD-10-CM

## 2011-04-14 DIAGNOSIS — Z1239 Encounter for other screening for malignant neoplasm of breast: Secondary | ICD-10-CM

## 2011-04-14 NOTE — Progress Notes (Signed)
Subjective:     Patient ID: [name], female   DOB: November 13, 1955, 56 y.o.   MRN: [id number]  HPI Pt doing well after port a cath removal.  She is still a little sore, but is mostly off pain medications. She continues to have some bilateral shoulder discomfort when she does any strenuous upper body activities.  She denies swelling.    Review of Systems Otherwise negative.      Objective:   Physical Exam  Head:  Normocephalic and atraumatic.   Eyes:  Conjunctivae are normal. Pupils are equal, round, and reactive to light. No scleral icterus.  Neck:  Normal range of motion. Neck supple. No tracheal deviation present. No thyromegaly present.  Cardiovascular: Normal rate, regular rhythm.   Respiratory: Effort normal.  No respiratory distress.  No chest wall tenderness.  L breast without nipple retraction or skin dimpling.  No masses or axillary lymphadenopathy.  R chest wall without recurrent mass.   GI:  Soft. There is no rebound and no guarding.  Musculoskeletal: . Extremities are non tender and without deformity.  Lymphadenopathy:   No cervical or axillary adenopathy.  Neurological: Alert and oriented to person, place, and time. Coordination normal.  Skin:  Skin is warm and dry. No rash noted. No diaphoresis. No erythema. No pallor.  Psychiatric: Normal mood and affect.Behavior is normal. Judgment and thought content normal.      Assessment/Plan:     R breast cancer, cT3N0Mx,  [facility] at [facility].    Follow up in 3 months.   No clinical evidence of recurrent breast cancer.

## 2011-04-14 NOTE — Assessment & Plan Note (Signed)
Get mammogram at [facility].    Follow up in 3 months.   No clinical evidence of recurrent breast cancer.

## 2011-04-14 NOTE — Patient Instructions (Signed)
Get mammogram when they schedule it.   Follow up with me in 3 months.

## 2011-04-25 ENCOUNTER — Ambulatory Visit
Admission: RE | Admit: 2011-04-25 | Discharge: 2011-04-25 | Disposition: A | Discharge status: Home / Self Care | Payer: Medicaid Other | Source: Ambulatory Visit | Attending: General Surgery | Admitting: General Surgery

## 2011-04-25 DIAGNOSIS — Z1231 Encounter for screening mammogram for malignant neoplasm of breast: Secondary | ICD-10-CM

## 2011-04-29 NOTE — Progress Notes (Signed)
Quick Note:  Please let pt know study looks good. No areas of concern. ______ 

## 2011-04-30 ENCOUNTER — Telehealth (INDEPENDENT_AMBULATORY_CARE_PROVIDER_SITE_OTHER): Payer: Self-pay

## 2011-04-30 NOTE — Telephone Encounter (Signed)
Per DR Byerly's request  pts daugher notified of mgm result. She will advise pt of result.

## 2011-05-30 ENCOUNTER — Ambulatory Visit (HOSPITAL_BASED_OUTPATIENT_CLINIC_OR_DEPARTMENT_OTHER): Payer: Medicaid Other | Admitting: Oncology

## 2011-05-30 ENCOUNTER — Telehealth: Payer: Self-pay | Admitting: *Deleted

## 2011-05-30 ENCOUNTER — Other Ambulatory Visit (HOSPITAL_BASED_OUTPATIENT_CLINIC_OR_DEPARTMENT_OTHER): Payer: Medicaid Other | Admitting: Lab

## 2011-05-30 VITALS — BP 149/91 | HR 84 | Temp 98.0°F | Ht <= 58 in | Wt 128.7 lb

## 2011-05-30 DIAGNOSIS — C50419 Malignant neoplasm of upper-outer quadrant of unspecified female breast: Secondary | ICD-10-CM

## 2011-05-30 DIAGNOSIS — E643 Sequelae of rickets: Secondary | ICD-10-CM

## 2011-05-30 DIAGNOSIS — C50911 Malignant neoplasm of unspecified site of right female breast: Secondary | ICD-10-CM

## 2011-05-30 DIAGNOSIS — E559 Vitamin D deficiency, unspecified: Secondary | ICD-10-CM

## 2011-05-30 DIAGNOSIS — C50919 Malignant neoplasm of unspecified site of unspecified female breast: Secondary | ICD-10-CM

## 2011-05-30 DIAGNOSIS — Z170 Estrogen receptor positive status [ER+]: Secondary | ICD-10-CM

## 2011-05-30 LAB — CBC WITH DIFFERENTIAL/PLATELET
BASO%: 0.9 % (ref 0.0–2.0)
EOS%: 6.9 % (ref 0.0–7.0)
MCH: 23.4 pg — ABNORMAL LOW (ref 25.1–34.0)
MCHC: 30.9 g/dL — ABNORMAL LOW (ref 31.5–36.0)
MONO#: 0.4 10*3/uL (ref 0.1–0.9)
RBC: 5.04 10*6/uL (ref 3.70–5.45)
RDW: 13.2 % (ref 11.2–14.5)
WBC: 7.5 10*3/uL (ref 3.9–10.3)
lymph#: 2.5 10*3/uL (ref 0.9–3.3)
nRBC: 0 % (ref 0–0)

## 2011-05-30 LAB — COMPREHENSIVE METABOLIC PANEL
ALT: 23 U/L (ref 0–35)
Albumin: 4.4 g/dL (ref 3.5–5.2)
BUN: 16 mg/dL (ref 6–23)
CO2: 25 mEq/L (ref 19–32)
Calcium: 9.7 mg/dL (ref 8.4–10.5)
Chloride: 101 mEq/L (ref 96–112)
Creatinine, Ser: 0.93 mg/dL (ref 0.50–1.10)
Potassium: 4.9 mEq/L (ref 3.5–5.3)

## 2011-05-30 LAB — LACTATE DEHYDROGENASE: LDH: 97 U/L (ref 94–250)

## 2011-05-30 MED ORDER — ALENDRONATE SODIUM 35 MG PO TABS: 35.0000 mg | ORAL_TABLET | ORAL | Status: AC

## 2011-05-30 NOTE — Progress Notes (Signed)
Hematology and Oncology Follow Up Visit  [name] [id number] 1955-11-03 56 y.o. 05/30/2011 10:50 AM PCP  1. Principle Diagnosis: History of a locally advanced estrogen receptor /HER2 positive, progesterone receptor negative infiltrating ductal carcinoma of the right breast measuring 7.7 cm by original breast MRI.  She has completed 4 cycles of neoadjuvant Abraxane/Herceptin given in combination 3 weeks on, 1 Herceptin alone on week 4.  Abraxane was then discontinued due to significant pain exacerbation in left shoulder, upper back, neck and chest region with neuropathy symptoms which have since abated while she is on gabapentin 300 mg p.o. t.i.d.  Status post right simple mastectomy with sentinel node dissection revealing ypT1c ypN0, estrogen receptor/HER2 positive, progesterone receptor negative disease  Interim History:  There have been no intercurrent illness, hospitalizations or medication changes. Recent bone density shows osteopenia, nl mammogram  Medications: I have reviewed the patient's current medications.  Allergies:  Allergies  Allergen Reactions  . Asa Buff (Mag (Buffered Aspirin) Nausea And Vomiting    Past Medical History, Surgical history, Social history, and Family History were reviewed and updated.  Review of Systems: Constitutional:  Negative for fever, chills, night sweats, anorexia, weight loss, pain. Cardiovascular: negative Respiratory: no cough, shortness of breath, or wheezing Neurological: negative Dermatological: negative ENT: negative Skin Gastrointestinal: no abdominal pain, change in bowel habits, or black or bloody stools Genito-Urinary: negative Hematological and Lymphatic: negative Breast: negative Musculoskeletal: positive for - joint stiffness Remaining ROS negative.  Physical Exam: Blood pressure 149/91, pulse 84, temperature 98 F (36.7 C), temperature source Oral, height 4\' 8"  (1.422 m), weight 128 lb 11.2 oz (58.378 kg). ECOG:    General appearance: alert, cooperative and appears stated age Head: Normocephalic, without obvious abnormality, atraumatic Neck: no adenopathy, no carotid bruit, no JVD, supple, symmetrical, trachea midline and thyroid not enlarged, symmetric, no tenderness/mass/nodules Lymph nodes: Cervical, supraclavicular, and axillary nodes normal. Cardiac : regular rate and rhythm, no murmurs or gallops Pulmonary:clear to auscultation bilaterally and normal percussion bilaterally Breasts: inspection negative, no nipple discharge or bleeding, no masses or nodularity palpable, rt chest wall NED Abdomen:soft, non-tender; bowel sounds normal; no masses,  no organomegaly Extremities negative Neuro: alert, oriented, normal speech, no focal findings or movement disorder noted  Lab Results: Lab Results  Component Value Date   WBC 7.5 05/30/2011   HGB 11.8 05/30/2011   HCT 38.2 05/30/2011   MCV 75.7* 05/30/2011   PLT 308 05/30/2011     Chemistry      Component Value Date/Time   NA 142 03/12/2011 1045   K 4.3 03/12/2011 1045   CL 104 03/12/2011 1045   CO2 28 03/12/2011 1045   BUN 8 03/12/2011 1045   CREATININE 0.77 03/12/2011 1045      Component Value Date/Time   CALCIUM 10.3 03/12/2011 1045   ALKPHOS 58 01/23/2011 0931   ALKPHOS 58 01/23/2011 0931   AST 30 01/23/2011 0931   AST 30 01/23/2011 0931   ALT 31 01/23/2011 0931   ALT 31 01/23/2011 0931   BILITOT 0.4 01/23/2011 0931   BILITOT 0.4 01/23/2011 0931      .pathology. Radiological Studies: chest X-ray n/a Mammogram 3/13-wnl Bone density 3/13- osteopenia   Impression and Plan: She is doing well, NED; f/u 6 mo; we will start her on low dose fosamax and encourage increase vitamin intake.  More than 50% of the visit was spent in patient-related counselling   Mariell Nester, MD 4/5/201310:50 AM

## 2011-05-30 NOTE — Telephone Encounter (Signed)
gave patient appointment for 12-2011 printed out calendar and gave to the patient 

## 2011-06-13 ENCOUNTER — Encounter: Payer: Self-pay | Admitting: Oncology

## 2011-06-13 NOTE — Progress Notes (Signed)
Patient received one prescription from [facility] pharmacy on 06/12/11 $56.17,her remaninig balance [facility] $4.99 and [facility]IGHT $368.54.

## 2011-07-17 ENCOUNTER — Encounter: Payer: Self-pay | Admitting: Oncology

## 2011-07-17 NOTE — Progress Notes (Signed)
Patient received one prescription from [facility] pharmacy on 07/16/11 $56.17,her remaninig balance [facility] $4.99 and ALIGHT $312.37.

## 2011-08-01 ENCOUNTER — Encounter (INDEPENDENT_AMBULATORY_CARE_PROVIDER_SITE_OTHER): Payer: Medicaid Other | Admitting: General Surgery

## 2011-08-05 ENCOUNTER — Ambulatory Visit (INDEPENDENT_AMBULATORY_CARE_PROVIDER_SITE_OTHER): Payer: Medicaid Other | Admitting: General Surgery

## 2011-08-05 ENCOUNTER — Other Ambulatory Visit (INDEPENDENT_AMBULATORY_CARE_PROVIDER_SITE_OTHER): Payer: Self-pay | Admitting: General Surgery

## 2011-08-05 ENCOUNTER — Encounter (INDEPENDENT_AMBULATORY_CARE_PROVIDER_SITE_OTHER): Payer: Self-pay | Admitting: General Surgery

## 2011-08-05 VITALS — BP 116/84 | HR 95 | Temp 97.8°F | Ht <= 58 in | Wt 132.4 lb

## 2011-08-05 DIAGNOSIS — C50919 Malignant neoplasm of unspecified site of unspecified female breast: Secondary | ICD-10-CM

## 2011-08-05 DIAGNOSIS — M25512 Pain in left shoulder: Secondary | ICD-10-CM

## 2011-08-05 DIAGNOSIS — C50911 Malignant neoplasm of unspecified site of right female breast: Secondary | ICD-10-CM

## 2011-08-05 MED ORDER — HYDROCODONE-ACETAMINOPHEN 5-500 MG PO TABS: 1.0000 | ORAL_TABLET | ORAL | Status: AC | PRN

## 2011-08-05 NOTE — Assessment & Plan Note (Signed)
Pain medication for left sided pain.    Refer to orthopaedic surgeon for shoulder pain.  Will likely refer to therapy, but would like to make sure no joint issues are present that can be improved.

## 2011-08-05 NOTE — Progress Notes (Signed)
HISTORY: The patient is a 56 year old female status post right mastectomy and sentinel lymph node biopsy. She had significant persistent right-sided pain. She also had left-sided pain. We took out her port a cath in hopes that her left-sided pain would resolve.  She continues to be very sore and have discomfort in the shoulder. She has tried ultram, diclofenac, and other pain medications.  She gets relief for 2 hours, but pain returns.  Most recently, she has been on diclofenac, and is now having stomach pain.     PERTINENT REVIEW OF SYSTEMS: Otherwise negative.    EXAM: Head: Normocephalic and atraumatic.  Eyes:  Conjunctivae are normal. Pupils are equal, round, and reactive to light. No scleral icterus.  Neck:  Normal range of motion. Neck supple. No tracheal deviation present. No thyromegaly present.  Resp: No respiratory distress, normal effort. Chest:  Right mastectomy flaps are without nodules.  No lymphadenopathy.  Non tender.  Left breast without masses.  Left port a cath site well healed.  Not particularly tender.   Muscle girdle of shoulder feels very tense.  No lymphadenopathy.   Abd:  Abdomen is soft, non distended and non tender. No masses are palpable.  There is no rebound and no guarding.  Neurological: Alert and oriented to person, place, and time. Coordination normal.  Skin: Skin is warm and dry. No rash noted. No diaphoretic. No erythema. No pallor.  Psychiatric: Normal mood and affect. Normal behavior. Judgment and thought content normal.   L  mammogram in 03/2011 normal.    ASSESSMENT AND PLAN:   R breast cancer, cT3N0Mx,  Pain medication for left sided pain.    Refer to orthopaedic surgeon for shoulder pain.  Will likely refer to therapy, but would like to make sure no joint issues are present that can be improved.    I am puzzled as to why she is still having so much pain.  I have never had a patient with this much discomfort after this surgery, and have definitely  not had a patient with such lasting pain.  I don't know if it is related to chemotherapy.  I will see her back in 3 months.       Maudry Diego, MD Surgical Oncology, General & Endocrine Surgery [facility]  Pierce Crane, MD, MD Pierce Crane, MD

## 2011-08-05 NOTE — Patient Instructions (Signed)
Follow up in 3 months

## 2011-08-20 ENCOUNTER — Telehealth (INDEPENDENT_AMBULATORY_CARE_PROVIDER_SITE_OTHER): Payer: Self-pay

## 2011-08-20 NOTE — Telephone Encounter (Signed)
Pt's daughter called because her mother had received a denial of benefits from Medicaid for Dr.Kramer of [facility].  I advised her to call Dr. Blenda Bridegroom office to inquire about her Medicaid coverage for office visits and MRI.  She will call us back if she has any more questions or concerns.

## 2011-09-09 ENCOUNTER — Other Ambulatory Visit: Payer: Self-pay | Admitting: *Deleted

## 2011-09-09 DIAGNOSIS — C50419 Malignant neoplasm of upper-outer quadrant of unspecified female breast: Secondary | ICD-10-CM

## 2011-09-09 MED ORDER — ANASTROZOLE 1 MG PO TABS: 1.0000 mg | ORAL_TABLET | Freq: Every day | ORAL | Status: DC

## 2011-10-17 ENCOUNTER — Encounter (INDEPENDENT_AMBULATORY_CARE_PROVIDER_SITE_OTHER): Payer: Self-pay | Admitting: General Surgery

## 2011-10-17 ENCOUNTER — Ambulatory Visit (INDEPENDENT_AMBULATORY_CARE_PROVIDER_SITE_OTHER): Payer: Medicaid Other | Admitting: General Surgery

## 2011-10-17 VITALS — BP 112/74 | HR 98 | Temp 97.4°F | Ht <= 58 in | Wt 134.4 lb

## 2011-10-17 DIAGNOSIS — C50911 Malignant neoplasm of unspecified site of right female breast: Secondary | ICD-10-CM

## 2011-10-17 DIAGNOSIS — C50919 Malignant neoplasm of unspecified site of unspecified female breast: Secondary | ICD-10-CM

## 2011-10-17 NOTE — Progress Notes (Signed)
HISTORY: The patient is a 56 year old female status post right mastectomy and sentinel lymph node biopsy. She had significant persistent right-sided pain. I referred her for orthopedic evaluation at last visit. She saw Dr. Fredric Mare and he recommended physical therapy. Her pain is much better as well as her mobility. She is able to fully abduct her arm and internally rotate it.  She only rarely takes analgesics.  She is continuing on the Arimidex.   PERTINENT REVIEW OF SYSTEMS: Otherwise negative.    EXAM: Head: Normocephalic and atraumatic.  Eyes:  Conjunctivae are normal. Pupils are equal, round, and reactive to light. No scleral icterus.  Neck:  Normal range of motion. Neck supple. No tracheal deviation present. No thyromegaly present.  Resp: No respiratory distress, normal effort. Chest:  Right mastectomy flaps are without nodules.  No lymphadenopathy.  Non tender.  Left breast without masses.  Left port a cath site well healed.    Much better mobility.  No lymphadenopathy.   Abd:  Abdomen is soft, non distended and non tender. No masses are palpable.  There is no rebound and no guarding.  Neurological: Alert and oriented to person, place, and time. Coordination normal.  Skin: Skin is warm and dry. No rash noted. No diaphoretic. No erythema. No pallor.  Psychiatric: Normal mood and affect. Normal behavior. Judgment and thought content normal.   L  mammogram in 03/2011 normal.    ASSESSMENT AND PLAN:   R breast cancer, cT3N0Mx,  Recommend follow up with me in 6 months. Continue arimidex   Follow up with dr. Donnie Coffin.    Right shoulder pain dramatically improved.      I am puzzled as to why she is still having so much pain.  I have never had a patient with this much discomfort after this surgery, and have definitely not had a patient with such lasting pain.  I don't know if it is related to chemotherapy.  I will see her back in 3 months.       Sabrina Diego, MD Surgical Oncology,  General & Endocrine Surgery [facility]  Pierce Crane, MD Pierce Crane, MD

## 2011-10-17 NOTE — Assessment & Plan Note (Signed)
Recommend follow up with me in 6 months. Continue arimidex   Follow up with dr. Donnie Coffin.    Right shoulder pain dramatically improved.

## 2011-10-17 NOTE — Patient Instructions (Addendum)
Get mammogram in February 2014.  If you are still living here, follow up with Dr. Donnie Coffin as scheduled and me in 6 months.  If you move, fax release of information to our office.

## 2011-12-30 ENCOUNTER — Other Ambulatory Visit: Payer: Medicaid Other | Admitting: Lab

## 2011-12-30 ENCOUNTER — Ambulatory Visit: Payer: Medicaid Other | Admitting: Oncology

## 2012-03-03 ENCOUNTER — Other Ambulatory Visit: Payer: Self-pay | Admitting: Physician Assistant

## 2012-03-03 ENCOUNTER — Other Ambulatory Visit: Payer: Self-pay | Admitting: *Deleted

## 2012-03-03 NOTE — Telephone Encounter (Signed)
Refill request for Anastrozole from pharmacy in [place], [place]. Last visit with Dr. Donnie Coffin 05/30/11 and she was FTKA for 6 month follow up on 12/30/11. Called her daughter, who reports she has now moved to [place]. Instructed daughter that she needs to have her oncologist or GYN seeing her now refill this for her. She needs to have routine follow up to ensure her safety. Also made her aware that Dr. Donnie Coffin is no longer in the practice. If she does not have a new oncologist and does not know how to get one, instructed her to call back and we will attempt to refer her to one close to her home. Notified [facility] that refill will not be approved by this office.

## 2012-03-12 ENCOUNTER — Encounter (INDEPENDENT_AMBULATORY_CARE_PROVIDER_SITE_OTHER): Payer: Self-pay | Admitting: General Surgery

## 2012-06-14 ENCOUNTER — Other Ambulatory Visit: Payer: Self-pay | Admitting: Oncology

## 2012-07-20 IMAGING — CT CT HEAD WO/W CM
1 of 2 series · 13 of 30 positions shown, 17 images · IV contrast (omnipaque)
Comparison: None.

CLINICAL DATA: Breast cancer.  Nausea vomiting

CT HEAD WITHOUT AND WITH CONTRAST
TECHNIQUE: Contiguous axial images were obtained from the base of
the skull through the vertex without and with intravenous contrast.
Contrast: 100mL OMNIPAQUE IOHEXOL 300 MG/ML IV SOLN

[Series 2: head w/o 4.8 h45s · axial · non-contrast · 0.43mm/px · z∈[-150,-36]mm · 13 of 30 slices shown, 17 images]
[im 3/30  brain]
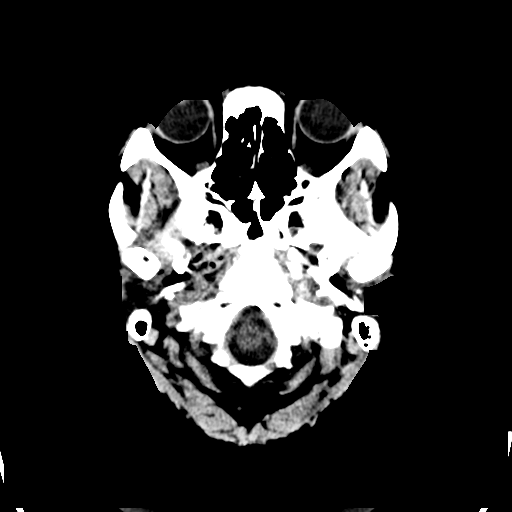
[im 3/30  bone]
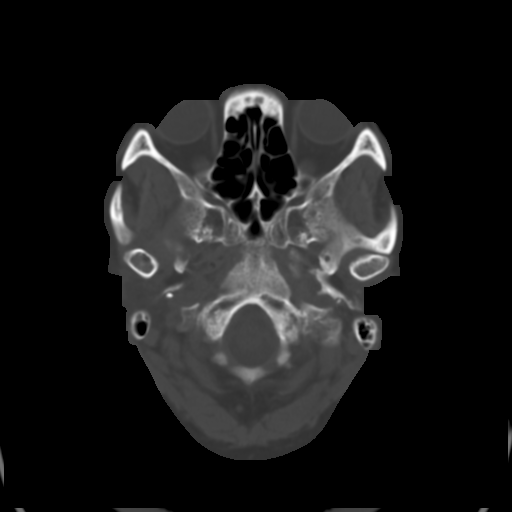
[im 5/30  brain]
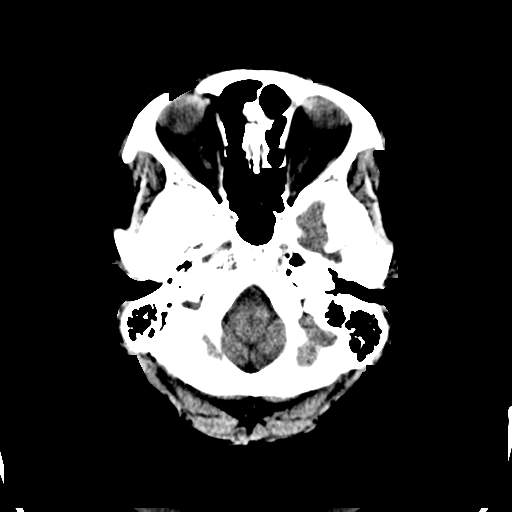
[im 7/30  brain]
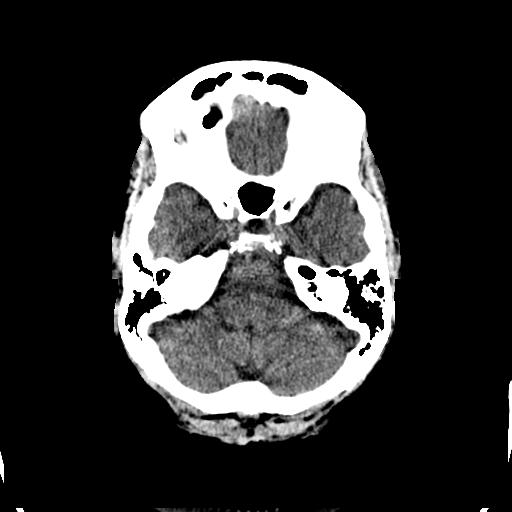
[im 9/30  brain]
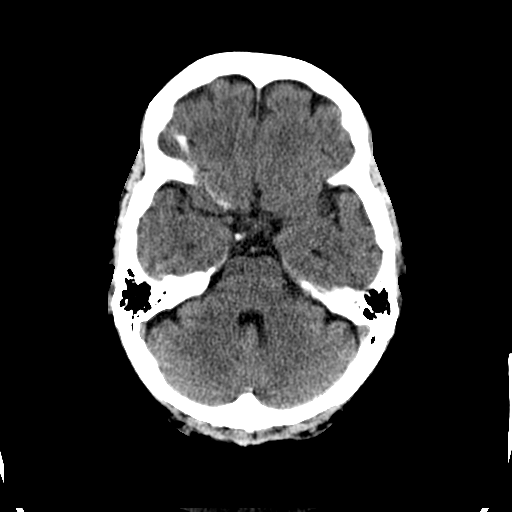
[im 11/30  brain]
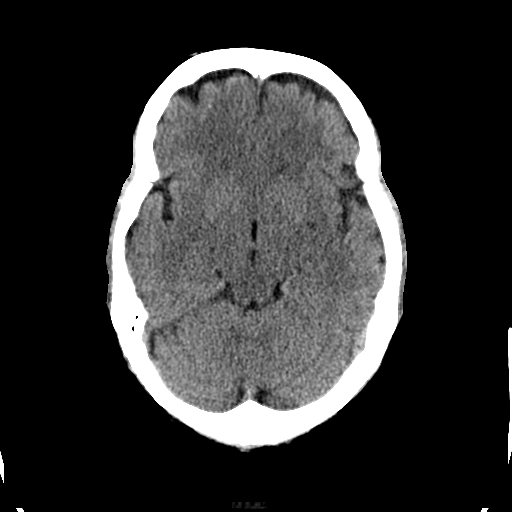
[im 11/30  bone]
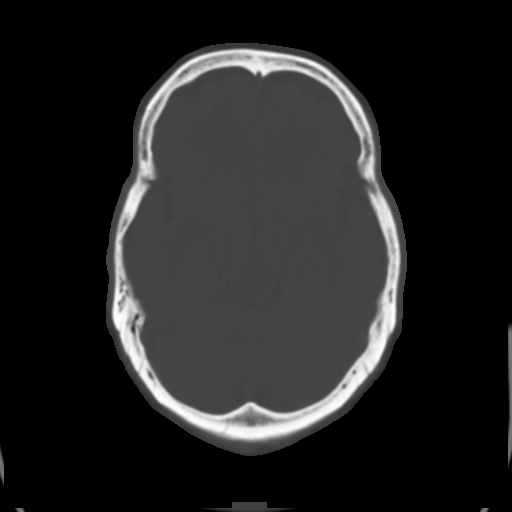
[im 13/30  brain]
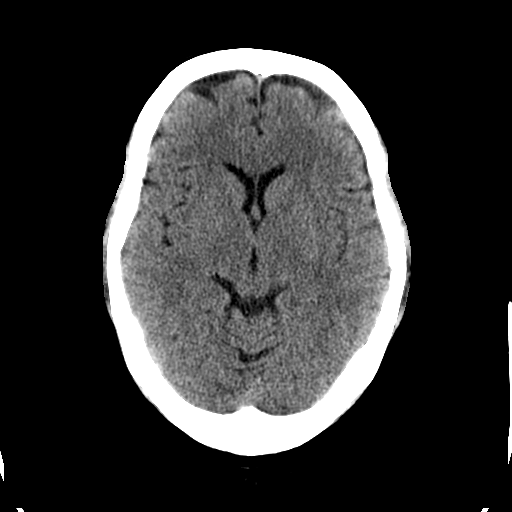
[im 15/30  brain]
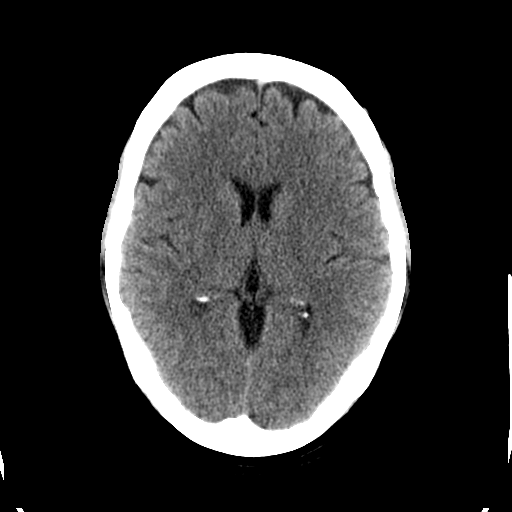
[im 17/30  brain]
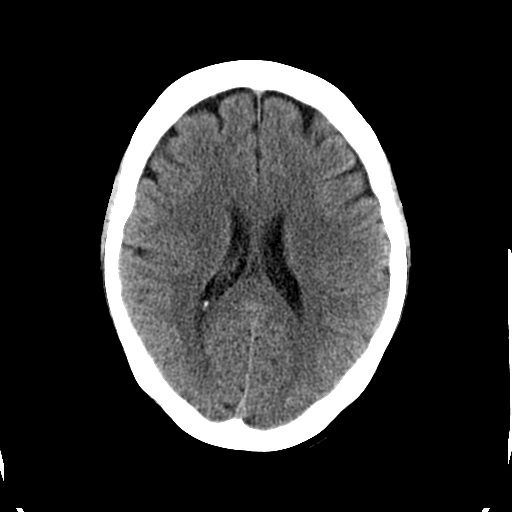
[im 19/30  brain]
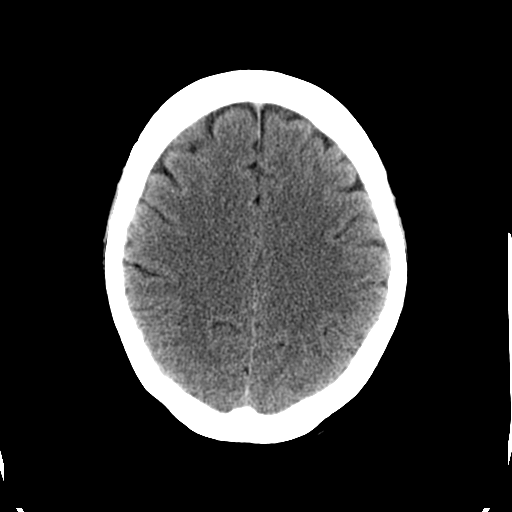
[im 19/30  bone]
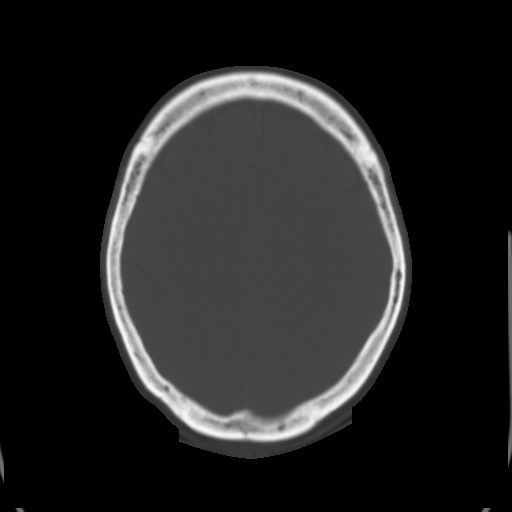
[im 21/30  brain]
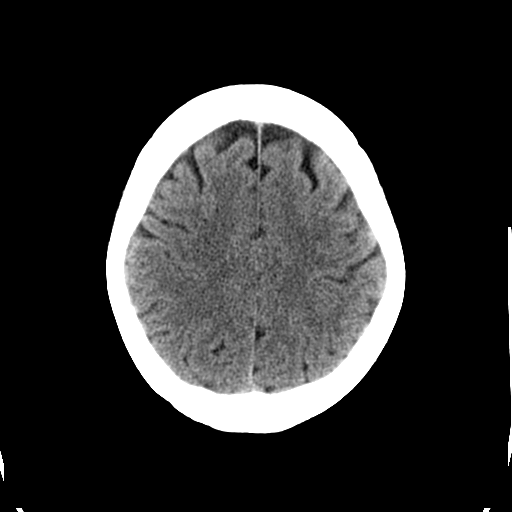
[im 23/30  brain]
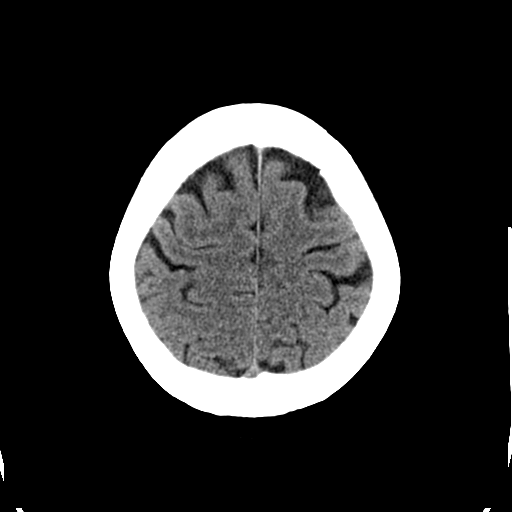
[im 25/30  brain]
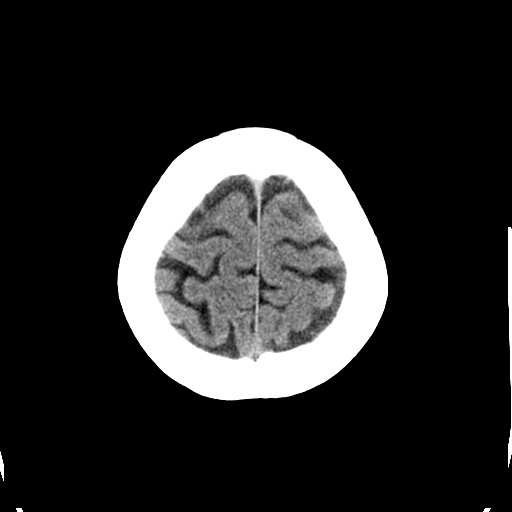
[im 27/30  brain]
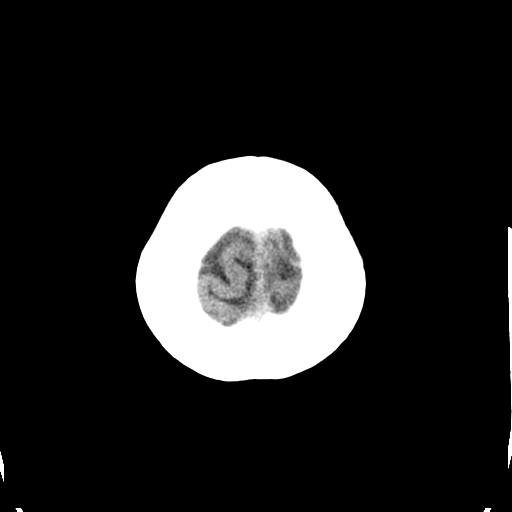
[im 27/30  bone]
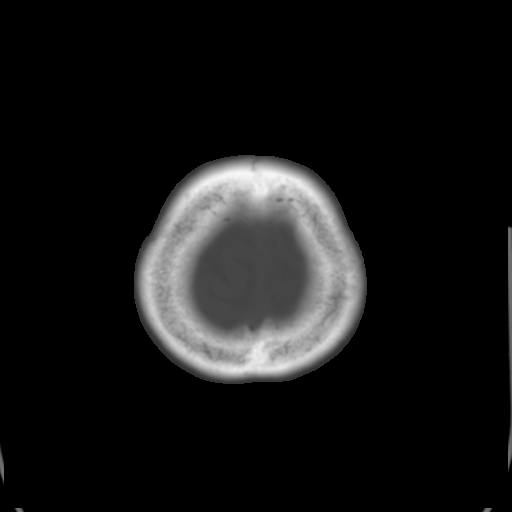

[13 of 30 positions shown; findings below may reference images not displayed]

FINDINGS: Ventricle size is normal.  Negative for acute or chronic
infarct.  Negative for hemorrhage or mass.  No enhancing lesion is
seen following contrast administration.

Calvarium is intact.
IMPRESSION: Negative

## 2014-02-11 ENCOUNTER — Other Ambulatory Visit: Payer: Self-pay | Admitting: Nurse Practitioner

## 2015-02-08 DIAGNOSIS — Z79811 Long term (current) use of aromatase inhibitors: Secondary | ICD-10-CM | POA: Diagnosis not present

## 2015-02-08 DIAGNOSIS — Z9221 Personal history of antineoplastic chemotherapy: Secondary | ICD-10-CM | POA: Diagnosis not present

## 2015-02-08 DIAGNOSIS — Z9011 Acquired absence of right breast and nipple: Secondary | ICD-10-CM | POA: Diagnosis not present

## 2015-02-08 DIAGNOSIS — Z79899 Other long term (current) drug therapy: Secondary | ICD-10-CM | POA: Diagnosis not present

## 2015-02-08 DIAGNOSIS — M8580 Other specified disorders of bone density and structure, unspecified site: Secondary | ICD-10-CM | POA: Diagnosis not present

## 2015-02-08 DIAGNOSIS — C50919 Malignant neoplasm of unspecified site of unspecified female breast: Secondary | ICD-10-CM | POA: Diagnosis not present

## 2015-02-08 DIAGNOSIS — C50911 Malignant neoplasm of unspecified site of right female breast: Secondary | ICD-10-CM | POA: Diagnosis not present

## 2015-08-30 ENCOUNTER — Ambulatory Visit (INDEPENDENT_AMBULATORY_CARE_PROVIDER_SITE_OTHER): Payer: Self-pay | Admitting: Physician Assistant

## 2015-08-30 VITALS — BP 169/83 | HR 79 | Temp 97.9°F | Resp 16 | Ht <= 58 in | Wt 124.4 lb

## 2015-08-30 DIAGNOSIS — E7800 Pure hypercholesterolemia, unspecified: Secondary | ICD-10-CM

## 2015-08-30 DIAGNOSIS — R12 Heartburn: Secondary | ICD-10-CM

## 2015-08-30 DIAGNOSIS — E119 Type 2 diabetes mellitus without complications: Secondary | ICD-10-CM

## 2015-08-30 DIAGNOSIS — M810 Age-related osteoporosis without current pathological fracture: Secondary | ICD-10-CM

## 2015-08-30 DIAGNOSIS — I10 Essential (primary) hypertension: Secondary | ICD-10-CM

## 2015-08-30 LAB — POCT GLYCOSYLATED HEMOGLOBIN (HGB A1C): Hemoglobin A1C: 5.9

## 2015-08-30 MED ORDER — METOPROLOL SUCCINATE ER 50 MG PO TB24: 50.0000 mg | ORAL_TABLET | Freq: Every day | ORAL | Status: DC

## 2015-08-30 MED ORDER — RANITIDINE HCL 150 MG PO TABS: 150.0000 mg | ORAL_TABLET | Freq: Every day | ORAL | Status: DC

## 2015-08-30 MED ORDER — METFORMIN HCL 500 MG PO TABS: 500.0000 mg | ORAL_TABLET | Freq: Two times a day (BID) | ORAL | Status: DC

## 2015-08-30 MED ORDER — ALENDRONATE SODIUM 35 MG PO TABS
35.0000 mg | ORAL_TABLET | ORAL | Status: DC
Start: 2015-08-30 — End: 2015-12-01

## 2015-08-30 NOTE — Progress Notes (Signed)
[name]  MRN: [id number] DOB: 12/23/1955  Subjective:  Pt presents to clinic for medication refills.  She has been out of all her medications for 3 weeks.  She has been having some headaches since she ran out of her medications.  She does not check her BP or glucose at home.  She typically sees Dr Zigmund Daniel in [place] but she has moved here now and wants to establish care.  She was switched from metoprolol to losartan and she does not feel good on this medication and she would like to switch back to metoprolol.  Patient Active Problem List   Diagnosis Date Noted  . HTN (hypertension) 08/30/2015  . Elevated cholesterol 08/30/2015  . Osteoporosis 08/30/2015  . Heartburn 08/30/2015  . Diabetes type 2, controlled ([place]) 08/30/2015  . R breast cancer, cT3N0Mx,  08/27/2010    Current Outpatient Prescriptions on File Prior to Visit  Medication Sig Dispense Refill  . anastrozole (ARIMIDEX) 1 MG tablet Take 1 tablet (1 mg total) by mouth daily. 30 tablet 3  . pravastatin (PRAVACHOL) 40 MG tablet Take 40 mg by mouth every evening.     . Multiple Vitamin (MULITIVITAMIN WITH MINERALS) TABS Take 2 tablets by mouth daily. Reported on 08/30/2015     No current facility-administered medications on file prior to visit.    Allergies  Allergen Reactions  . Asa Buff (Mag [Buffered Aspirin] Nausea And Vomiting    Review of Systems  Constitutional: Negative for fever and chills.  Respiratory: Negative for shortness of breath.   Cardiovascular: Negative for chest pain, palpitations and leg swelling.  Skin: Negative for wound.  Neurological: Negative for numbness.   Objective:  BP 169/83 mmHg  Pulse 79  Temp(Src) 97.9 F (36.6 C) (Oral)  Resp 16  Ht 4\' 7"  (1.397 m)  Wt 124 lb 6.4 oz (56.427 kg)  BMI 28.91 kg/m2  SpO2 100%  Physical Exam  Constitutional: She is oriented to person, place, and time and well-developed, well-nourished, and in no distress.  HENT:  Head: Normocephalic and  atraumatic.  Right Ear: Hearing and external ear normal.  Left Ear: Hearing and external ear normal.  Eyes: Conjunctivae are normal.  Neck: Normal range of motion.  Cardiovascular: Normal rate, regular rhythm and normal heart sounds.   No murmur heard. Pulmonary/Chest: Effort normal and breath sounds normal.  Musculoskeletal:       Right lower leg: She exhibits no edema.       Left lower leg: She exhibits no edema.  Neurological: She is alert and oriented to person, place, and time. Gait normal.  Skin: Skin is warm and dry.  Diabetic Foot Exam - Simple   Simple Foot Form  Diabetic Foot exam was performed with the following findings:  Yes  08/30/2015 12:32 PM  Visual Inspection  No deformities, no ulcerations, no other skin breakdown bilaterally:  Yes  Sensation Testing  Intact to touch and monofilament testing bilaterally:  Yes  Pulse Check  Posterior Tibialis and Dorsalis pulse intact bilaterally:  Yes  Comments      Psychiatric: Mood, memory, affect and judgment normal.  Vitals reviewed.   Results for orders placed or performed in visit on 08/30/15  POCT glycosylated hemoglobin (Hb A1C)  Result Value Ref Range   Hemoglobin A1C 5.9     Assessment and Plan :  Essential hypertension - Plan: TSH, metoprolol succinate (TOPROL-XL) 50 MG 24 hr tablet  Elevated cholesterol - Plan: Lipid panel  Osteoporosis - Plan: alendronate (FOSAMAX) 35  MG tablet  Heartburn - Plan: ranitidine (ZANTAC) 150 MG tablet  Controlled type 2 diabetes mellitus without complication, without long-term current use of insulin (HCC) - Plan: POCT glycosylated hemoglobin (Hb A1C), COMPLETE METABOLIC PANEL WITH GFR, metFORMIN (GLUCOPHAGE) 500 MG tablet   Pt was given a list of providers taking new patients - she is to call and make a fu appt with one of them - if she cannot get an appt within a month she is to f/u here due to medication switch today.  She should continue her other medications as she was on  prior to her visit.  Windell Hummingbird PA-C  [facility] 08/30/2015 12:31 PM

## 2015-08-30 NOTE — Patient Instructions (Addendum)
  Please make an appt with one of the providers that are on the list that I have you that are taking new patients.  Recheck in a month for your BP to make sure the medication is controlling your BP.  I will send in your cholesterol medication once your lab results are back.   IF you received an x-ray today, you will receive an invoice from [facility]. Please contact [facility] at [phone] with questions or concerns regarding your invoice.   IF you received labwork today, you will receive an invoice from [employer]. Please contact [facility] at [phone] with questions or concerns regarding your invoice.   Our billing staff will not be able to assist you with questions regarding bills from these companies.  You will be contacted with the lab results as soon as they are available. The fastest way to get your results is to activate your My Chart account. Instructions are located on the last page of this paperwork. If you have not heard from [place] regarding the results in 2 weeks, please contact this office.

## 2015-08-31 ENCOUNTER — Encounter: Payer: Self-pay | Admitting: Physician Assistant

## 2015-08-31 ENCOUNTER — Telehealth: Payer: Self-pay

## 2015-08-31 LAB — COMPLETE METABOLIC PANEL WITHOUT GFR
ALT: 23 U/L (ref 6–29)
AST: 19 U/L (ref 10–35)
Albumin: 4.4 g/dL (ref 3.6–5.1)
Alkaline Phosphatase: 45 U/L (ref 33–130)
BUN: 13 mg/dL (ref 7–25)
CO2: 25 mmol/L (ref 20–31)
Calcium: 9.5 mg/dL (ref 8.6–10.4)
Chloride: 107 mmol/L (ref 98–110)
Creat: 0.72 mg/dL (ref 0.50–0.99)
GFR, Est African American: >89 mL/min
GFR, Est Non African American: >89 mL/min
Glucose, Bld: 122 mg/dL — ABNORMAL HIGH (ref 65–99)
Potassium: 3.9 mmol/L (ref 3.5–5.3)
Sodium: 142 mmol/L (ref 135–146)
Total Bilirubin: 0.7 mg/dL (ref 0.2–1.2)
Total Protein: 7.3 g/dL (ref 6.1–8.1)

## 2015-08-31 LAB — LIPID PANEL
Cholesterol: 156 mg/dL (ref 125–200)
HDL: 42 mg/dL — ABNORMAL LOW
LDL Cholesterol: 81 mg/dL
Total CHOL/HDL Ratio: 3.7 ratio
Triglycerides: 164 mg/dL — ABNORMAL HIGH
VLDL: 33 mg/dL — ABNORMAL HIGH

## 2015-08-31 LAB — TSH: TSH: 1.22 m[IU]/L

## 2015-08-31 MED ORDER — PRAVASTATIN SODIUM 40 MG PO TABS: 40.0000 mg | ORAL_TABLET | Freq: Every evening | ORAL | Status: DC

## 2015-08-31 NOTE — Telephone Encounter (Signed)
Pt's daughter called, I advised Rx's [facility].

## 2015-08-31 NOTE — Telephone Encounter (Signed)
Pt is needing to talk with someone about the medications that was given last night at his visit they are not at the pharmacy   Best number [phone]

## 2015-08-31 NOTE — Addendum Note (Signed)
Addended by: Mancel Bale on: 08/31/2015 11:36 AM   Modules accepted: Orders

## 2015-09-29 ENCOUNTER — Ambulatory Visit (INDEPENDENT_AMBULATORY_CARE_PROVIDER_SITE_OTHER): Payer: Self-pay | Admitting: Physician Assistant

## 2015-09-29 VITALS — BP 142/80 | HR 86 | Temp 98.0°F | Resp 16 | Ht <= 58 in | Wt 124.2 lb

## 2015-09-29 DIAGNOSIS — I10 Essential (primary) hypertension: Secondary | ICD-10-CM

## 2015-09-29 DIAGNOSIS — Z760 Encounter for issue of repeat prescription: Secondary | ICD-10-CM

## 2015-09-29 DIAGNOSIS — Z853 Personal history of malignant neoplasm of breast: Secondary | ICD-10-CM

## 2015-09-29 MED ORDER — VITAMIN D3 50 MCG (2000 UT) PO TABS: 1.0000 | ORAL_TABLET | Freq: Every day | ORAL | 2 refills | Status: DC

## 2015-09-29 MED ORDER — METOPROLOL SUCCINATE ER 50 MG PO TB24: 50.0000 mg | ORAL_TABLET | Freq: Every day | ORAL | 0 refills | Status: DC

## 2015-09-29 NOTE — Progress Notes (Signed)
09/29/2015 5:05 PM   DOB: Feb 19, 1956 / MRN: [id number]  SUBJECTIVE:  [name] is a 60 y.o. female presenting for medication refills.  She is recently moved from [place] where and she was seeing an oncologist there who was helping her manage her personal history of breast cancer.  She is self pay and her daughter is with her.  She takes metoprolol for HTN and would like this refilled today. She denies leg swelling, SOB, chest pain, vision changes and orthopnea.  She is not routinely checking her BP at home.   She is asking for a vitamin D supplement and wants to hold of on screening her levels as she has no insurance at this time. She is taking 1000 mg daily. She would like a refill of her anastrazole, though she is not sure if she should still be taking this because one oncologist told her to take this for 5 years, which she has, and another told her ten years.  She has no oncology follow up in place at this time.   She is allergic to asa buff (mag [buffered aspirin].   She  has a past medical history of Anxiety; Cancer ([place]); Cough; Hyperlipidemia; Hypertension; and Indigestion.    She  reports that she has never smoked. She has never used smokeless tobacco. She reports that she does not drink alcohol or use drugs. She  has no sexual activity history on file. The patient  has a past surgical history that includes Mastectomy (09/12/10); Portacath placement; UTI (recent); Port-a-cath removal (03/19/2011); and Breast surgery.  Her family history is not on file.  Review of Systems  Constitutional: Negative for chills and fever.  Eyes: Negative for blurred vision.  Respiratory: Negative for cough and shortness of breath.   Cardiovascular: Negative for chest pain.  Gastrointestinal: Negative for abdominal pain and nausea.  Genitourinary: Negative for dysuria, frequency and urgency.  Musculoskeletal: Negative for myalgias.  Skin: Negative for rash.  Neurological: Negative for dizziness,  tingling and headaches.  Psychiatric/Behavioral: Negative for depression. The patient is not nervous/anxious.     The problem list and medications were reviewed and updated by myself where necessary and exist elsewhere in the encounter.   OBJECTIVE:  BP (!) 142/80   Pulse 86   Temp 98 F (36.7 C) (Oral)   Resp 16   Ht 4\' 7"  (1.397 m)   Wt 124 lb 3.2 oz (56.3 kg)   SpO2 98%   BMI 28.87 kg/m   Physical Exam  Constitutional: She is oriented to person, place, and time. She appears well-nourished. No distress.  Eyes: EOM are normal. Pupils are equal, round, and reactive to light.  Cardiovascular: Normal rate.   Pulmonary/Chest: Effort normal.  Abdominal: She exhibits no distension.  Neurological: She is alert and oriented to person, place, and time. No cranial nerve deficit. Gait normal.  Skin: Skin is dry. She is not diaphoretic.  Psychiatric: She has a normal mood and affect.  Vitals reviewed.   No results found for this or any previous visit (from the past 72 hour(s)).  No results found.    ASSESSMENT AND PLAN  [name] was seen today for medication refill.  Diagnoses and all orders for this visit:  History of breast cancer: See the final problem.   Essential hypertension: Largely controlled.  Will continue current plan for now and see her back in two months.  Advised that she call the day before.  -     metoprolol succinate (TOPROL-XL) 50  MG 24 hr tablet; Take 1 tablet (50 mg total) by mouth daily. Take with or immediately following a meal.  Encounter for medication refill: She has a history of cancer and it seems that she is lost to oncology follow up.  I have placed a referral so that she may establish with the cancer center.  Will hold off on any specialty medication refills unless instructed otherwise by oncology.  -     Ambulatory referral to Oncology -     Cholecalciferol (VITAMIN D3) 2000 units TABS; Take 1 tablet by mouth daily.    The patient is advised to  call or return to clinic if she does not see an improvement in symptoms, or to seek the care of the closest emergency department if she worsens with the above plan.   Philis Fendt, MHS, PA-C [facility] 09/29/2015 5:05 PM

## 2015-09-29 NOTE — Patient Instructions (Signed)
     IF you received an x-ray today, you will receive an invoice from [facility]. Please contact  Radiology at [phone] with questions or concerns regarding your invoice.   IF you received labwork today, you will receive an invoice from [facility]. Please contact [facility] at [phone] with questions or concerns regarding your invoice.   Our billing staff will not be able to assist you with questions regarding bills from these companies.  You will be contacted with the lab results as soon as they are available. The fastest way to get your results is to activate your My Chart account. Instructions are located on the last page of this paperwork. If you have not heard from us regarding the results in 2 weeks, please contact this office.      

## 2015-10-01 ENCOUNTER — Encounter: Payer: Self-pay | Admitting: Physician Assistant

## 2015-11-09 ENCOUNTER — Other Ambulatory Visit: Payer: Self-pay | Admitting: Physician Assistant

## 2015-11-09 ENCOUNTER — Other Ambulatory Visit (HOSPITAL_COMMUNITY): Payer: Self-pay | Admitting: Speech Pathology

## 2015-11-09 DIAGNOSIS — Z1231 Encounter for screening mammogram for malignant neoplasm of breast: Secondary | ICD-10-CM

## 2015-11-20 ENCOUNTER — Ambulatory Visit
Admission: RE | Admit: 2015-11-20 | Discharge: 2015-11-20 | Disposition: A | Discharge status: Home / Self Care | Payer: Medicare Other | Source: Ambulatory Visit | Attending: Physician Assistant | Admitting: Physician Assistant

## 2015-11-20 DIAGNOSIS — Z1231 Encounter for screening mammogram for malignant neoplasm of breast: Secondary | ICD-10-CM

## 2015-11-23 ENCOUNTER — Other Ambulatory Visit: Payer: Self-pay | Admitting: Physician Assistant

## 2015-11-23 DIAGNOSIS — R928 Other abnormal and inconclusive findings on diagnostic imaging of breast: Secondary | ICD-10-CM

## 2015-11-28 ENCOUNTER — Other Ambulatory Visit: Payer: Self-pay | Admitting: Physician Assistant

## 2015-11-28 ENCOUNTER — Other Ambulatory Visit: Payer: Self-pay

## 2015-11-28 DIAGNOSIS — R928 Other abnormal and inconclusive findings on diagnostic imaging of breast: Secondary | ICD-10-CM

## 2015-11-29 ENCOUNTER — Ambulatory Visit
Admission: RE | Admit: 2015-11-29 | Discharge: 2015-11-29 | Disposition: A | Discharge status: Home / Self Care | Payer: Medicare Other | Source: Ambulatory Visit | Attending: Physician Assistant | Admitting: Physician Assistant

## 2015-11-29 DIAGNOSIS — R928 Other abnormal and inconclusive findings on diagnostic imaging of breast: Secondary | ICD-10-CM

## 2015-11-29 DIAGNOSIS — R922 Inconclusive mammogram: Secondary | ICD-10-CM | POA: Diagnosis not present

## 2015-12-01 ENCOUNTER — Ambulatory Visit (INDEPENDENT_AMBULATORY_CARE_PROVIDER_SITE_OTHER): Payer: Medicare Other | Admitting: Physician Assistant

## 2015-12-01 VITALS — BP 110/70 | HR 73 | Temp 98.1°F | Resp 16 | Ht <= 58 in | Wt 126.8 lb

## 2015-12-01 DIAGNOSIS — Z1159 Encounter for screening for other viral diseases: Secondary | ICD-10-CM | POA: Diagnosis not present

## 2015-12-01 DIAGNOSIS — I10 Essential (primary) hypertension: Secondary | ICD-10-CM

## 2015-12-01 DIAGNOSIS — C50411 Malignant neoplasm of upper-outer quadrant of right female breast: Secondary | ICD-10-CM

## 2015-12-01 DIAGNOSIS — Z23 Encounter for immunization: Secondary | ICD-10-CM

## 2015-12-01 DIAGNOSIS — Z114 Encounter for screening for human immunodeficiency virus [HIV]: Secondary | ICD-10-CM

## 2015-12-01 DIAGNOSIS — M810 Age-related osteoporosis without current pathological fracture: Secondary | ICD-10-CM | POA: Diagnosis not present

## 2015-12-01 DIAGNOSIS — E119 Type 2 diabetes mellitus without complications: Secondary | ICD-10-CM

## 2015-12-01 LAB — CBC WITH DIFFERENTIAL/PLATELET
BASOS PCT: 1 %
Basophils Absolute: 74 cells/uL (ref 0–200)
EOS ABS: 740 {cells}/uL — AB (ref 15–500)
Eosinophils Relative: 10 %
HCT: 37.3 % (ref 35.0–45.0)
HEMOGLOBIN: 11.4 g/dL — AB (ref 11.7–15.5)
LYMPHS PCT: 38 %
Lymphs Abs: 2812 cells/uL (ref 850–3900)
MCH: 23.1 pg — AB (ref 27.0–33.0)
MCHC: 30.6 g/dL — ABNORMAL LOW (ref 32.0–36.0)
MCV: 75.7 fL — ABNORMAL LOW (ref 80.0–100.0)
MONO ABS: 666 {cells}/uL (ref 200–950)
MPV: 9.8 fL (ref 7.5–12.5)
Monocytes Relative: 9 %
NEUTROS PCT: 42 %
Neutro Abs: 3108 cells/uL (ref 1500–7800)
PLATELETS: 333 10*3/uL (ref 140–400)
RBC: 4.93 MIL/uL (ref 3.80–5.10)
RDW: 14.3 % (ref 11.0–15.0)
WBC: 7.4 10*3/uL (ref 3.8–10.8)

## 2015-12-01 LAB — COMPLETE METABOLIC PANEL WITH GFR
ALBUMIN: 4.1 g/dL (ref 3.6–5.1)
ALK PHOS: 45 U/L (ref 33–130)
ALT: 20 U/L (ref 6–29)
AST: 20 U/L (ref 10–35)
BILIRUBIN TOTAL: 0.6 mg/dL (ref 0.2–1.2)
BUN: 13 mg/dL (ref 7–25)
CO2: 28 mmol/L (ref 20–31)
CREATININE: 0.83 mg/dL (ref 0.50–0.99)
Calcium: 9.4 mg/dL (ref 8.6–10.4)
Chloride: 102 mmol/L (ref 98–110)
GFR, EST NON AFRICAN AMERICAN: 77 mL/min (ref >=60)
GFR, Est African American: 89 mL/min (ref >=60)
GLUCOSE: 133 mg/dL — AB (ref 65–99)
Potassium: 4.2 mmol/L (ref 3.5–5.3)
SODIUM: 140 mmol/L (ref 135–146)
TOTAL PROTEIN: 7 g/dL (ref 6.1–8.1)

## 2015-12-01 LAB — HEMOGLOBIN A1C
Hgb A1c MFr Bld: 5.9 % — ABNORMAL HIGH (ref <5.7)
Mean Plasma Glucose: 123 mg/dL

## 2015-12-01 MED ORDER — METFORMIN HCL 500 MG PO TABS: 500.0000 mg | ORAL_TABLET | Freq: Two times a day (BID) | ORAL | 1 refills | Status: DC

## 2015-12-01 MED ORDER — VITAMIN D3 50 MCG (2000 UT) PO TABS: 1.0000 | ORAL_TABLET | Freq: Every day | ORAL | 2 refills | Status: DC

## 2015-12-01 MED ORDER — SIMVASTATIN 20 MG PO TABS: 20.0000 mg | ORAL_TABLET | Freq: Every day | ORAL | 0 refills | Status: DC

## 2015-12-01 MED ORDER — ALENDRONATE SODIUM 35 MG PO TABS: 35.0000 mg | ORAL_TABLET | ORAL | 4 refills | Status: DC

## 2015-12-01 MED ORDER — ANASTROZOLE 1 MG PO TABS: 1.0000 mg | ORAL_TABLET | Freq: Every day | ORAL | 0 refills | Status: DC

## 2015-12-01 MED ORDER — ZOSTER VACCINE LIVE 19400 UNT/0.65ML ~~LOC~~ SUSR: 0.6500 mL | Freq: Once | SUBCUTANEOUS | 0 refills | Status: AC

## 2015-12-01 MED ORDER — METOPROLOL SUCCINATE ER 50 MG PO TB24: 50.0000 mg | ORAL_TABLET | Freq: Every day | ORAL | 0 refills | Status: DC

## 2015-12-01 NOTE — Patient Instructions (Addendum)
Please make an appt with an eye doctor to check your eyes because of your diabetes.     IF you received an x-ray today, you will receive an invoice from [facility]. Please contact [facility] at [phone] with questions or concerns regarding your invoice.   IF you received labwork today, you will receive an invoice from [employer]. Please contact [facility] at [phone] with questions or concerns regarding your invoice.   Our billing staff will not be able to assist you with questions regarding bills from these companies.  You will be contacted with the lab results as soon as they are available. The fastest way to get your results is to activate your My Chart account. Instructions are located on the last page of this paperwork. If you have not heard from [place] regarding the results in 2 weeks, please contact this office.

## 2015-12-01 NOTE — Progress Notes (Signed)
[name]  MRN: [id number] DOB: Jul 05, 1955  Subjective:  Pt presents to clinic for medication refills.  She is not feeling well with the pravachol and she has been on Zocor 20mg  in the past and she would like to go back on that one.  Arimidex - she has not been on it for 5-6 months since she moved down [place] - she has just gotten her insurance situated and she would now like to see an oncologist in [place] -- she had a referral done but at the time did not have insurance and she would like another referral at this time.  Review of Systems  Constitutional: Negative for chills and fever.  Respiratory: Negative for shortness of breath.   Cardiovascular: Negative for chest pain, palpitations and leg swelling.  Skin: Negative for wound.  Neurological: Negative for numbness and headaches.    Patient Active Problem List   Diagnosis Date Noted  . HTN (hypertension) 08/30/2015  . Elevated cholesterol 08/30/2015  . Osteoporosis 08/30/2015  . Heartburn 08/30/2015  . Diabetes type 2, controlled ([place]) 08/30/2015  . R breast cancer, cT3N0Mx,  08/27/2010    Current Outpatient Prescriptions on File Prior to Visit  Medication Sig Dispense Refill  . Multiple Vitamin (MULITIVITAMIN WITH MINERALS) TABS Take 2 tablets by mouth daily. Reported on 08/30/2015     No current facility-administered medications on file prior to visit.     Allergies  Allergen Reactions  . Asa Buff (Mag [Buffered Aspirin] Nausea And Vomiting    Pt patients past, family and social history were reviewed and updated.  Objective:  BP 110/70   Pulse 73   Temp 98.1 F (36.7 C) (Oral)   Resp 16   Ht 4\' 7"  (1.397 m)   Wt 126 lb 12.8 oz (57.5 kg)   SpO2 98%   BMI 29.47 kg/m   Physical Exam  Constitutional: She is oriented to person, place, and time and well-developed, well-nourished, and in no distress.  HENT:  Head: Normocephalic and atraumatic.  Right Ear: Hearing and external ear normal.  Left Ear: Hearing and  external ear normal.  Eyes: Conjunctivae are normal.  Neck: Normal range of motion.  Cardiovascular: Normal rate, regular rhythm and normal heart sounds.   No murmur heard. Pulmonary/Chest: Effort normal and breath sounds normal.  Musculoskeletal:       Right lower leg: She exhibits no edema.       Left lower leg: She exhibits no edema.  Neurological: She is alert and oriented to person, place, and time. Gait normal.  Skin: Skin is warm and dry.  Psychiatric: Mood, memory, affect and judgment normal.  Vitals reviewed.   Assessment and Plan :  Essential hypertension - Plan: metoprolol succinate (TOPROL-XL) 50 MG 24 hr tablet - good control  Needs flu shot - Plan: Flu Vaccine QUAD 36+ mos IM  Age-related osteoporosis without current pathological fracture - Plan: alendronate (FOSAMAX) 35 MG tablet, Cholecalciferol (VITAMIN D3) 2000 units TABS - continue current medication - she will likely need a bone density as the patient is new to the area and is not sure when her last evaluation was done -   Controlled type 2 diabetes mellitus without complication, without long-term current use of insulin ([place]) - Plan: metFORMIN (GLUCOPHAGE) 500 MG tablet, COMPLETE METABOLIC PANEL WITH GFR, Hemoglobin A1c, Microalbumin, urine, simvastatin (ZOCOR) 20 MG tablet, HM DIABETES FOOT EXAM - has been controlled - will check labs today - changed the patient from pravachol to simvastatin per the  patient request  Malignant neoplasm of upper-outer quadrant of right female breast, unspecified estrogen receptor status ([place]) - Plan: anastrozole (ARIMIDEX) 1 MG tablet, CBC with Differential/Platelet, Ambulatory referral to Oncology - will restart the patient on medication - one of her oncologist said 5 years and the other said 10 years - we will get her started back on the medication until she is able to see her new oncologist.  Need for hepatitis C screening test - Plan: Hepatitis C antibody  Encounter for screening  for HIV - Plan: HIV antibody  Need for shingles vaccine - Plan: Zoster Vaccine Live, PF, (ZOSTAVAX) 09811 UNT/0.65ML injection - pt will plan to get at the pharmacy and get them to send me records.  Recheck in 3 months  Windell Hummingbird PA-C  [facility] 12/01/2015 11:43 AM

## 2015-12-02 LAB — HEPATITIS C ANTIBODY: HCV AB: NEGATIVE

## 2015-12-02 LAB — HIV ANTIBODY (ROUTINE TESTING W REFLEX): HIV 1&2 Ab, 4th Generation: NONREACTIVE

## 2015-12-04 ENCOUNTER — Encounter: Payer: Self-pay | Admitting: Physician Assistant

## 2015-12-04 LAB — MICROALBUMIN, URINE: Microalb, Ur: 1.2 mg/dL

## 2015-12-20 ENCOUNTER — Encounter: Payer: Self-pay | Admitting: Hematology

## 2015-12-20 ENCOUNTER — Telehealth: Payer: Self-pay | Admitting: Hematology

## 2015-12-20 NOTE — Telephone Encounter (Signed)
Appt scheduled w/Feng on 11/27 @11am . Gave the appt date and time to the pt's daughter who will be bringing the pt. Demographics verified. Letter mailed.

## 2015-12-21 ENCOUNTER — Other Ambulatory Visit: Payer: Self-pay | Admitting: Physician Assistant

## 2015-12-21 DIAGNOSIS — I10 Essential (primary) hypertension: Secondary | ICD-10-CM

## 2015-12-25 ENCOUNTER — Other Ambulatory Visit: Payer: Self-pay | Admitting: Physician Assistant

## 2015-12-25 DIAGNOSIS — C50411 Malignant neoplasm of upper-outer quadrant of right female breast: Secondary | ICD-10-CM

## 2015-12-27 NOTE — Telephone Encounter (Signed)
12/01/15 last ov and labs

## 2016-01-03 NOTE — Telephone Encounter (Signed)
Done

## 2016-01-16 NOTE — Progress Notes (Signed)
[name]  [phone] Fax:([phone]) [place] Note   Patient Care Team: Mancel Bale, PA-C as PCP - General (Physician Assistant) 01/21/2016   Referring physician: Elizabeth Sauer   CHIEF COMPLAINTS/PURPOSE OF CONSULTATION:  Follow up right breast cancer  Oncology History   Breast cancer of upper-outer quadrant of right female breast [facility])   Staging form: Breast, AJCC 7th Edition   - Clinical stage from 04/22/2010: Stage IIB (T3, N0, M0) - Signed by Truitt Merle, MD on 01/21/2016   - Pathologic stage from 09/11/2010: Stage IA (yT1c, N0, cM0) - Signed by Truitt Merle, MD on 01/21/2016      Breast cancer of upper-outer quadrant of right female breast ([place])   04/22/2010 Initial Diagnosis    Breast cancer of upper-outer quadrant of right female breast ([place])      04/22/2010 Receptors her2     ER 50% positive, PR negative, HER-2 positive      04/22/2010 Initial Biopsy     Right  Breast mass pops A showed invasive ductal carcinoma, G3      04/25/2010 Imaging    Breast MRI showed a 7.7 cm abnormal linear enhancement and confluent masses in the upper outer quadrant of the right breast which correlate with the recently biopsied breast cancer.      05/09/2010 Imaging    PET scan was  Negative for metastatic adenopathy ordistant metastasis.      04/2010 -  Chemotherapy    She received 4 cycle of Abraxane and Herceptin every 3 weeks       09/11/2010 Surgery     Right breast mastectomy and sentinel lymph node biopsy      09/2010 -  Anti-estrogen oral therapy     Adjuvant anastrozole 1 mg daily        HISTORY OF PRESENTING ILLNESS (01/21/2016):  [name] 60 y.o. female is here because of ongoing breast cancer surveillance. 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 She underwent right breast mastectomy with Dr. Barry Dienes on 09/11/2010 which was negative for invasive malignancy or  lymph node involvement. This was found to be estrogen receptor / HER2 positive, progesterone receptor negative disease. She did not receive radiation therapy.  She saw Dr. Eston Esters for oncology care. From Dr. Julien Girt last note on 05/30/2011: "She has completed 4 cycles of neoadjuvant Abraxane/Herceptin given in combination 3 weeks on, 1 Herceptin alone on week 4.  Abraxane was then discontinued due to significant pain exacerbation in left shoulder, upper back, neck and chest region with neuropathy symptoms which have since abated while she is on gabapentin 300 mg p.o. T.i.d.".   She presents today with her daughter and granddaughter. The patient does not speak English well so her daughter helped translate and facilitate our visit today. The patient moved to [place] in 2013 around July. She saw an oncologist in [place] but does not recall their name. She began Arimidex after completing chemotherapy and ran out about a month ago. She moved back to [facility] this June. She had annual breast exams while in [place]. She is not sure when her last bone density scan was. She was told to take Arimidex for 10 years.  When she first began taking medication she experienced full body pain and was switched to another medication in [place]. She continues to experience pain though it is not as much and is tolerable. She complains of pain/numbness around her right chest wall  post-mastectomy. She has chronic back pain.   She denies abdominal pain, cough, or shortness of breath. She is eating and sleeping well. She has not been diagnosed with any other major health issues or had any major surgeries recently.    MEDICAL HISTORY:  Past Medical History:  Diagnosis Date  . Anxiety    ATIVAN HELPS PT TO SLEEP  . Cancer ([place])    breast right side -COMPLETED CHEMO WITH DR. Collier Salina RUBIN--EXCEPT ARIMIDEX  . Cough    PT HAS HAD COUGH FOR PAST MONTH-WHITE PHLEGM  . Hyperlipidemia   . Hypertension     . Indigestion    TAKING ZANTAC    SURGICAL HISTORY: Past Surgical History:  Procedure Laterality Date  . BREAST SURGERY    . MASTECTOMY  09/12/10   right  . PORT-A-CATH REMOVAL  03/19/2011   Procedure: REMOVAL PORT-A-CATH;  Surgeon: Stark Klein, MD;  Location: [facility];  Service: General;  Laterality: N/A;  . PORTACATH PLACEMENT    . UTI  recent   pt has been taking an antibiotic for recent UTI    SOCIAL HISTORY: Social History   Social History  . Marital status: Single    Spouse name: N/A  . Number of children: N/A  . Years of education: N/A   Occupational History  . Not on file.   Social History Main Topics  . Smoking status: Never Smoker  . Smokeless tobacco: Never Used  . Alcohol use No  . Drug use: No  . Sexual activity: Not on file   Other Topics Concern  . Not on file   Social History Narrative  . No narrative on file    FAMILY HISTORY: Family History  Problem Relation Age of Onset  . Cancer Father     unknown type cancer     ALLERGIES:  is allergic to asa buff (mag [buffered aspirin].  MEDICATIONS:  Current Outpatient Prescriptions  Medication Sig Dispense Refill  . alendronate (FOSAMAX) 35 MG tablet Take 1 tablet (35 mg total) by mouth every 7 (seven) days. Take with a full glass of water on an empty stomach. 12 tablet 4  . anastrozole (ARIMIDEX) 1 MG tablet TAKE 1 TABLET(1 MG) BY MOUTH DAILY 30 tablet 5  . Cholecalciferol (VITAMIN D3) 2000 units TABS Take 1 tablet by mouth daily. 90 tablet 2  . metFORMIN (GLUCOPHAGE) 500 MG tablet Take 1 tablet (500 mg total) by mouth 2 (two) times daily with a meal. 180 tablet 1  . metoprolol succinate (TOPROL-XL) 50 MG 24 hr tablet Take 1 tablet (50 mg total) by mouth daily. Take with or immediately following a meal. 90 tablet 0  . Multiple Vitamin (MULITIVITAMIN WITH MINERALS) TABS Take 2 tablets by mouth daily. Reported on 08/30/2015    . simvastatin (ZOCOR) 20 MG tablet Take 1 tablet (20 mg total) by mouth at  bedtime. 90 tablet 0   No current facility-administered medications for this visit.     REVIEW OF SYSTEMS:   Constitutional: Denies fevers, chills or abnormal night sweats Eyes: Denies blurriness of vision, double vision or watery eyes Ears, nose, mouth, throat, and face: Denies mucositis or sore throat Respiratory: Denies cough, dyspnea or wheezes Cardiovascular: Denies palpitation, chest discomfort or lower extremity swelling Gastrointestinal:  Denies nausea, heartburn or change in bowel habits Skin: Denies abnormal skin rashes Lymphatics: Denies new lymphadenopathy or easy bruising Musculoskeletal: (+) chronic back pain, pain/numbness in right chest wall post-mastectomy Neurological:Denies numbness, tingling or new weaknesses Behavioral/Psych: Mood is stable,  no new changes  All other systems were reviewed with the patient and are negative.  PHYSICAL EXAMINATION: ECOG PERFORMANCE STATUS: 0 - Asymptomatic  Vitals:   01/21/16 1121  BP: 138/72  Pulse: 91  Resp: 18  Temp: 97.8 F (36.6 C)   Filed Weights   01/21/16 1121  Weight: 128 lb 8 oz (58.3 kg)    GENERAL:alert, no distress and comfortable SKIN: skin color, texture, turgor are normal, no rashes or significant lesions EYES: normal, conjunctiva are pink and non-injected, sclera clear OROPHARYNX:no exudate, no erythema and lips, buccal mucosa, and tongue normal  NECK: supple, thyroid normal size, non-tender, without nodularity LYMPH:  no palpable lymphadenopathy in the cervical, axillary or inguinal LUNGS: clear to auscultation and percussion with normal breathing effort HEART: regular rate & rhythm and no murmurs and no lower extremity edema ABDOMEN:abdomen soft, non-tender and normal bowel sounds Musculoskeletal:no cyanosis of digits and no clubbing  PSYCH: alert & oriented x 3 with fluent speech NEURO: no focal motor/sensory deficits BREAST: Left breast and axillary region is without palpable abnormality or  nodules. S/p right breast mastectomy with well healed surgical scar and no palpable nodularity.   LABORATORY DATA:  I have reviewed the data as listed CBC Latest Ref Rng & Units 12/01/2015 05/30/2011 03/12/2011  WBC 3.8 - 10.8 K/uL 7.4 7.5 8.5  Hemoglobin 11.7 - 15.5 g/dL 11.4(L) 11.8 10.7(L)  Hematocrit 35.0 - 45.0 % 37.3 38.2 35.2(L)  Platelets 140 - 400 K/uL 333 308 373    PATHOLOGY    RADIOGRAPHIC STUDIES: I have personally reviewed the radiological images as listed and agreed with the findings in the report.  MR Breast bilateral w wo contrast 05/02/2010 IMPRESSION: 7.7 cm abnormal linear enhancement and confluent masses in the upper outer quadrant of the right breast which correlate with the recently biopsied breast cancer.  Diagnostic mammogram 11/29/2015 IMPRESSION: There is no persistent abnormality in the area of concern in the left breast, most likely representing normal fibroglandular tissue. RECOMMENDATION: Screening mammogram in one year.(Code:SM-B-01Y)  Screening mammogram 11/22/2015 IMPRESSION: Further evaluation is suggested for possible mass in the left breast. RECOMMENDATION: Diagnostic mammogram and possibly ultrasound of the left breast. (Code:FI-L-28M)  ASSESSMENT & PLAN:   1.  Breast cancer of upper-outer quadrant of right breast, cT3N0M0 stage IIB, ER+/PR-/HER2+ -I extensively reviewed the patient's chart and history - I discussed the risk of cancer recurrence. She received neoadjuvant chemotherapy with Abraxane and Herceptin,  But did not complete 1 year Herceptin due to side effects. She is at moderate to high risk for recurrence. -I discussed  Adjuvant endocrine therapy with the patient. We discussed arimidex use for 5 years versus 10 years,  And the data shows decreased recurrence with extended aromatase inhibitor.  Given her moderate to high risk of recurrence, I recommend her to continue anastrozole for 10 years. She has been tolerating well overall,  agrees to continue. -I reviewed her recent labs and mammogram, there is no evidence of recurrence. -She will continue to follow with [place] for breast cancer surveillance. She will continue annual screening mammograms. - I encouraged her to have healthy diet and exercise regularly.  2. Bone health -Patient is unsure when her last bone density scan was performed -I spoke with the patient about the risk of decreased bone strength while taking anastrazole -She is taking calcium and Vitamin D. I have encouraged the patient to increase physical activity to help with bone strength -I have ordered the patient for a DEXA  3. HTN  and DM - she will continue follow-up with her primary care physician  And continue medication  Plan: -I have refilled the patient's anastrozole today,  And she will restart the next few days. -I have ordered DEXA to be done within the next month -RTC in 6 months for follow up  No orders of the defined types were placed in this encounter.   All questions were answered. The patient knows to call the clinic with any problems, questions or concerns. I spent 30 minutes counseling the patient face to face. The total time spent in the appointment was 40 minutes and more than 50% was on counseling.  This document serves as a record of services personally performed by Truitt Merle, MD. It was created on her behalf by Arlyce Harman, a trained medical scribe. The creation of this record is based on the scribe's personal observations and the provider's statements to them. This document has been checked and approved by the attending provider.     Truitt Merle, MD 01/21/2016 5:09 PM

## 2016-01-21 ENCOUNTER — Telehealth: Payer: Self-pay | Admitting: Hematology

## 2016-01-21 ENCOUNTER — Encounter: Payer: Self-pay | Admitting: Hematology

## 2016-01-21 ENCOUNTER — Ambulatory Visit (HOSPITAL_BASED_OUTPATIENT_CLINIC_OR_DEPARTMENT_OTHER): Payer: Medicare Other | Admitting: Hematology

## 2016-01-21 VITALS — BP 138/72 | HR 91 | Temp 97.8°F | Resp 18 | Ht <= 58 in | Wt 128.5 lb

## 2016-01-21 DIAGNOSIS — Z79811 Long term (current) use of aromatase inhibitors: Secondary | ICD-10-CM | POA: Diagnosis not present

## 2016-01-21 DIAGNOSIS — E2839 Other primary ovarian failure: Secondary | ICD-10-CM

## 2016-01-21 DIAGNOSIS — C50411 Malignant neoplasm of upper-outer quadrant of right female breast: Secondary | ICD-10-CM

## 2016-01-21 DIAGNOSIS — Z170 Estrogen receptor positive status [ER+]: Secondary | ICD-10-CM | POA: Diagnosis not present

## 2016-01-21 DIAGNOSIS — Z853 Personal history of malignant neoplasm of breast: Secondary | ICD-10-CM

## 2016-01-21 MED ORDER — ANASTROZOLE 1 MG PO TABS: ORAL_TABLET | ORAL | 5 refills | Status: DC

## 2016-01-21 NOTE — Progress Notes (Signed)
Introduced myself as her FA.  Informed her of the copay assistance programs.  Pt doesn't know if she's met her deductible or not but would like to wait and see what they will pay for her treatment before applying.  We also discussed the [place] but pt stated they make more than the income requirement at this time.  I will monitor her billing and if her ins leaves her a balance for her treatment I will reach out to her to apply to [facility] and PAF.

## 2016-01-21 NOTE — Telephone Encounter (Signed)
Appointments scheduled per 01/21/16 los. A copy of the  AVS report and appointment schedule was given to patient,per 01/21/16 los.  °

## 2016-02-22 ENCOUNTER — Other Ambulatory Visit: Payer: Self-pay | Admitting: Physician Assistant

## 2016-02-22 DIAGNOSIS — E119 Type 2 diabetes mellitus without complications: Secondary | ICD-10-CM

## 2016-03-14 ENCOUNTER — Other Ambulatory Visit: Payer: Self-pay | Admitting: Physician Assistant

## 2016-03-14 DIAGNOSIS — I10 Essential (primary) hypertension: Secondary | ICD-10-CM

## 2016-05-16 ENCOUNTER — Other Ambulatory Visit: Payer: Self-pay | Admitting: Physician Assistant

## 2016-05-16 DIAGNOSIS — E119 Type 2 diabetes mellitus without complications: Secondary | ICD-10-CM

## 2016-05-29 ENCOUNTER — Other Ambulatory Visit: Payer: Self-pay | Admitting: Physician Assistant

## 2016-05-29 DIAGNOSIS — I10 Essential (primary) hypertension: Secondary | ICD-10-CM

## 2016-06-03 ENCOUNTER — Other Ambulatory Visit: Payer: Self-pay | Admitting: Physician Assistant

## 2016-06-03 DIAGNOSIS — E119 Type 2 diabetes mellitus without complications: Secondary | ICD-10-CM

## 2016-06-14 ENCOUNTER — Other Ambulatory Visit: Payer: Self-pay | Admitting: Hematology

## 2016-06-14 ENCOUNTER — Other Ambulatory Visit: Payer: Self-pay | Admitting: Physician Assistant

## 2016-06-14 DIAGNOSIS — Z853 Personal history of malignant neoplasm of breast: Secondary | ICD-10-CM

## 2016-06-14 DIAGNOSIS — E119 Type 2 diabetes mellitus without complications: Secondary | ICD-10-CM

## 2016-06-26 ENCOUNTER — Other Ambulatory Visit: Payer: Self-pay | Admitting: Physician Assistant

## 2016-06-26 DIAGNOSIS — I10 Essential (primary) hypertension: Secondary | ICD-10-CM

## 2016-06-27 NOTE — Telephone Encounter (Signed)
Pt need an appt with me in the next month

## 2016-07-10 ENCOUNTER — Other Ambulatory Visit: Payer: Self-pay | Admitting: Hematology

## 2016-07-10 ENCOUNTER — Other Ambulatory Visit: Payer: Self-pay | Admitting: Physician Assistant

## 2016-07-10 DIAGNOSIS — Z853 Personal history of malignant neoplasm of breast: Secondary | ICD-10-CM

## 2016-07-10 DIAGNOSIS — E119 Type 2 diabetes mellitus without complications: Secondary | ICD-10-CM

## 2016-07-18 NOTE — Progress Notes (Signed)
[name]  Telephone:([phone]) [phone] Fax:(336) [phone]  Clinic Follow Up Note   Patient Care Team: [name] as PCP - General (Physician Assistant) 07/22/2016   CHIEF COMPLAINTS:  Follow up right breast cancer  Oncology History   Breast cancer of upper-outer quadrant of right female breast [facility])   Staging form: Breast, AJCC 7th Edition   - Clinical stage from 04/22/2010: Stage IIB (T3, N0, M0) - Signed by Sabrina Merle, MD on 01/21/2016   - Pathologic stage from 09/11/2010: Stage IA (yT1c, N0, cM0) - Signed by Sabrina Merle, MD on 01/21/2016      Breast cancer of upper-outer quadrant of right female breast ([place])   04/22/2010 Initial Diagnosis    Breast cancer of upper-outer quadrant of right female breast ([place])      04/22/2010 Receptors her2     ER 50% positive, PR negative, HER-2 positive      04/22/2010 Initial Biopsy     Right  Breast mass pops A showed invasive ductal carcinoma, G3      04/25/2010 Imaging    Breast MRI showed a 7.7 cm abnormal linear enhancement and confluent masses in the upper outer quadrant of the right breast which correlate with the recently biopsied breast cancer.      05/09/2010 Imaging    PET scan was  Negative for metastatic adenopathy ordistant metastasis.      04/2010 -  Chemotherapy    She received 4 cycle of Abraxane and Herceptin every 3 weeks       09/11/2010 Surgery     Right breast mastectomy and sentinel lymph node biopsy      09/2010 -  Anti-estrogen oral therapy     Adjuvant anastrozole 1 mg daily        HISTORY OF PRESENTING ILLNESS (01/21/2016):  [name] 61 y.o. female is here because of ongoing breast cancer surveillance. 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 She underwent right breast mastectomy with Dr. Barry Pham on 09/11/2010 which was negative for invasive malignancy or lymph node involvement. This was found to be estrogen receptor / HER2  positive, progesterone receptor negative disease. She did not receive radiation therapy.  She saw Dr. Eston Pham for oncology care. From Dr. Julien Pham last note on 05/30/2011: "She has completed 4 cycles of neoadjuvant Abraxane/Herceptin given in combination 3 weeks on, 1 Herceptin alone on week 4.  Abraxane was then discontinued due to significant pain exacerbation in left shoulder, upper back, neck and chest region with neuropathy symptoms which have since abated while she is on gabapentin 300 mg p.o. T.i.d.".   She presents with her daughter and granddaughter. The patient does not speak English well so her daughter helped translate and facilitate our visit today. The patient moved to [place] in 2013 around July. She saw an oncologist in [place] but does not recall their name. She began Arimidex after completing chemotherapy and ran out about a month ago. She moved back to [facility] this June. She had annual breast exams while in [place]. She is not sure when her last bone density scan was. She was told to take Arimidex for 10 years.  When she first began taking medication she experienced full body pain and was switched to another medication in [place]. She continues to experience pain though it is not as much and is tolerable. She complains of pain/numbness around her right chest wall post-mastectomy. She has chronic back pain.   She  denies abdominal pain, cough, or shortness of breath. She is eating and sleeping well. She has not been diagnosed with any other major health issues or had any major surgeries recently.   Current Therapy: Anastrozole  INTERIM HISTORY Patient presents today for follow up. She is accompanied by her daughter who is speaking for her. She has no trouble with anastrozole. She says she has not received any calls about a bone density scan. She is more active with her grandchildren. She has joint pain in her fingers which is tolerable. She recently fell. She  is clinically doing fine.    MEDICAL HISTORY:  Past Medical History:  Diagnosis Date  . Anxiety    ATIVAN HELPS PT TO SLEEP  . Cancer ([place])    breast right side -COMPLETED CHEMO WITH [name]--EXCEPT ARIMIDEX  . Cough    PT HAS HAD COUGH FOR PAST MONTH-WHITE PHLEGM  . Hyperlipidemia   . Hypertension   . Indigestion    TAKING ZANTAC    SURGICAL HISTORY: Past Surgical History:  Procedure Laterality Date  . BREAST SURGERY    . MASTECTOMY  09/12/10   right  . PORT-A-CATH REMOVAL  03/19/2011   Procedure: REMOVAL PORT-A-CATH;  Surgeon: Sabrina Klein, MD;  Location: [facility];  Service: General;  Laterality: N/A;  . PORTACATH PLACEMENT    . UTI  recent   pt has been taking an antibiotic for recent UTI    SOCIAL HISTORY: Social History   Social History  . Marital status: Single    Spouse name: N/A  . Number of children: N/A  . Years of education: N/A   Occupational History  . Not on file.   Social History Main Topics  . Smoking status: Never Smoker  . Smokeless tobacco: Never Used  . Alcohol use No  . Drug use: No  . Sexual activity: Not on file   Other Topics Concern  . Not on file   Social History Narrative  . No narrative on file    FAMILY HISTORY: Family History  Problem Relation Age of Onset  . Cancer Father        unknown type cancer     ALLERGIES:  is allergic to asa buff (mag [buffered aspirin].  MEDICATIONS:  Current Outpatient Prescriptions  Medication Sig Dispense Refill  . alendronate (FOSAMAX) 35 MG tablet Take 1 tablet (35 mg total) by mouth every 7 (seven) days. Take with a full glass of water on an empty stomach. 12 tablet 4  . anastrozole (ARIMIDEX) 1 MG tablet TAKE 1 TABLET BY MOUTH DAILY 90 tablet 1  . Cholecalciferol (VITAMIN D3) 2000 units TABS Take 1 tablet by mouth daily. 90 tablet 2  . metFORMIN (GLUCOPHAGE) 500 MG tablet TAKE 1 TABLET(500 MG) BY MOUTH TWICE DAILY WITH A MEAL 60 tablet 0  . metFORMIN (GLUCOPHAGE) 500 MG tablet  TAKE 1 TABLET(500 MG) BY MOUTH TWICE DAILY WITH A MEAL 180 tablet 0  . metoprolol succinate (TOPROL-XL) 50 MG 24 hr tablet TAKE 1 TABLET BY MOUTH DAILY WITH OR IMMEDIATELY FOLLOWING A MEAL 30 tablet 0  . Multiple Vitamin (MULITIVITAMIN WITH MINERALS) TABS Take 2 tablets by mouth daily. Reported on 08/30/2015    . simvastatin (ZOCOR) 20 MG tablet TAKE 1 TABLET BY MOUTH EVERY NIGHT AT BEDTIME 30 tablet 0   No current facility-administered medications for this visit.     REVIEW OF SYSTEMS:   Constitutional: Denies fevers, chills or abnormal night sweats Eyes: Denies blurriness of vision, double  vision or watery eyes Ears, nose, mouth, throat, and face: Denies mucositis or sore throat Respiratory: Denies cough, dyspnea or wheezes Cardiovascular: Denies palpitation, chest discomfort or lower extremity swelling Gastrointestinal:  Denies nausea, heartburn or change in bowel habits Skin: Denies abnormal skin rashes Lymphatics: Denies new lymphadenopathy or easy bruising Musculoskeletal: (+) back pain knee pain, tolerable joint pain in fingers Neurological:Denies, tingling or new weaknesses (+)numbness Behavioral/Psych: Mood is stable, no new changes  All other systems were reviewed with the patient and are negative.  PHYSICAL EXAMINATION:  ECOG PERFORMANCE STATUS: 0 - Asymptomatic  Vitals:   07/22/16 0935  BP: 136/71  Pulse: 82  Resp: 20  Temp: 98.5 F (36.9 C)   Filed Weights   07/22/16 0935  Weight: 130 lb (59 kg)    GENERAL:alert, no distress and comfortable SKIN: skin color, texture, turgor are normal, no rashes or significant lesions EYES: normal, conjunctiva are pink and non-injected, sclera clear OROPHARYNX:no exudate, no erythema and lips, buccal mucosa, and tongue normal  NECK: supple, thyroid normal size, non-tender, without nodularity LYMPH:  no palpable lymphadenopathy in the cervical, axillary or inguinal LUNGS: clear to auscultation and percussion with normal breathing  effort HEART: regular rate & rhythm and no murmurs and no lower extremity edema ABDOMEN:abdomen soft, non-tender and normal bowel sounds Musculoskeletal:no cyanosis of digits and no clubbing  PSYCH: alert & oriented x 3 with fluent speech NEURO: no focal motor/sensory deficits  BREAST: Left breast and axillary region is without palpable abnormality or nodules. S/p right breast mastectomy with well healed surgical scar and no palpable nodularity.   LABORATORY DATA:  I have reviewed the data as listed CBC Latest Ref Rng & Units 07/22/2016 12/01/2015 05/30/2011  WBC 3.9 - 10.3 10e3/uL 8.1 7.4 7.5  Hemoglobin 11.6 - 15.9 g/dL 11.2(L) 11.4(L) 11.8  Hematocrit 34.8 - 46.6 % 37.1 37.3 38.2  Platelets 145 - 400 10e3/uL 260 333 308    PATHOLOGY    RADIOGRAPHIC STUDIES: I have personally reviewed the radiological images as listed and agreed with the findings in the report.  MR Breast bilateral w wo contrast 05/02/2010 IMPRESSION: 7.7 cm abnormal linear enhancement and confluent masses in the upper outer quadrant of the right breast which correlate with the recently biopsied breast cancer.  Diagnostic mammogram 11/29/2015 IMPRESSION: There is no persistent abnormality in the area of concern in the left breast, most likely representing normal fibroglandular tissue. RECOMMENDATION: Screening mammogram in one year.(Code:SM-B-01Y)  Screening mammogram 11/22/2015 IMPRESSION: Further evaluation is suggested for possible mass in the left breast. RECOMMENDATION: Diagnostic mammogram and possibly ultrasound of the left breast. (Code:FI-L-89M)  ASSESSMENT & PLAN:   1.  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 She received neoadjuvant chemotherapy with Abraxane and Herceptin,  But did not complete 1 year Herceptin due to side  effects. She is at moderate to high risk for recurrence. -I previously discussed  Adjuvant endocrine therapy with the patient. We discussed arimidex use for 5 years versus 10 years,  And the data shows decreased recurrence with extended aromatase inhibitor.  Given her moderate to high risk of recurrence, I recommend her to continue anastrozole for 7 years. She has been tolerating well overall, agrees to continue. -She has mild arthralgia, possible related to anastrozole, manageable. No other significant side effects. -Clinic and doing well, lab results reviewed with her, exam was unremarkable,  sentinel clinical concern for recurrence. -She will continue to follow with [place] for breast cancer surveillance. She will continue annual screening mammograms. - I encouraged her to have healthy diet and exercise regularly. - continue anastrozole until summer 2019.  - DEXA scan within next month - due for annual Mammogram in Oct   2. Bone health -Patient is unsure when her last bone density scan was performed -I previously spoke with the patient about the risk of decreased bone strength while taking anastrazole -She is taking calcium and Vitamin D. I previously encouraged the patient to increase physical activity to help with bone strength -I previously ordered the patient for a DEXA - Patient has tolerable joint pain in her fingers and hip paiin. I encouraged her to take Tylenol over over the counter joint medication to help. She does not want to take it. - DEXA within the next month  3. HTN and DM - she will continue follow-up with her primary care physician  And continue medication  4. Cancer screening  -She never had a colonoscopy, she is agreeable to have a screening colonoscopy. I'll refer her to the [facility].  Plan: - DEXA scan within next month - annual mammogram in October - refer to GI for colon-cancer screening  - RTC f/u in 6 months -Continue anastrozole until summer of 2019     Orders Placed This Encounter  Procedures  . MM DIAG BREAST TOMO UNI LEFT    Standing Status:   Future    Standing Expiration Date:   09/21/2017    Order Specific Question:   Reason for Exam (SYMPTOM  OR DIAGNOSIS REQUIRED)    Answer:   screening    Order Specific Question:   Preferred imaging location?    Answer:   [facility]  . Ambulatory referral to Gastroenterology    Referral Priority:   Routine    Referral Type:   Consultation    Referral Reason:   Specialty Services Required    Number of Visits Requested:   1    All questions were answered. The patient knows to call the clinic with any problems, questions or concerns. I spent 30 minutes counseling the patient face to face. The total time spent in the appointment was 40 minutes and more than 50% was on counseling.  This document serves as a record of services personally performed by Sabrina Merle, MD. It was created on her behalf by Brandt Loosen, a trained medical scribe. The creation of this record is based on the scribe's personal observations and the provider's statements to them. This document has been checked and approved by the attending provider.      Sabrina Merle, MD 07/22/2016 3:05 PM

## 2016-07-21 ENCOUNTER — Other Ambulatory Visit: Payer: Self-pay | Admitting: Physician Assistant

## 2016-07-21 DIAGNOSIS — I10 Essential (primary) hypertension: Secondary | ICD-10-CM

## 2016-07-22 ENCOUNTER — Encounter: Payer: Self-pay | Admitting: Hematology

## 2016-07-22 ENCOUNTER — Ambulatory Visit (HOSPITAL_BASED_OUTPATIENT_CLINIC_OR_DEPARTMENT_OTHER): Payer: Medicare Other | Admitting: Hematology

## 2016-07-22 ENCOUNTER — Other Ambulatory Visit (HOSPITAL_BASED_OUTPATIENT_CLINIC_OR_DEPARTMENT_OTHER): Payer: Medicare Other

## 2016-07-22 ENCOUNTER — Telehealth: Payer: Self-pay | Admitting: Hematology

## 2016-07-22 VITALS — BP 136/71 | HR 82 | Temp 98.5°F | Resp 20 | Ht <= 58 in | Wt 130.0 lb

## 2016-07-22 DIAGNOSIS — Z170 Estrogen receptor positive status [ER+]: Secondary | ICD-10-CM | POA: Diagnosis not present

## 2016-07-22 DIAGNOSIS — C50411 Malignant neoplasm of upper-outer quadrant of right female breast: Secondary | ICD-10-CM

## 2016-07-22 DIAGNOSIS — Z79811 Long term (current) use of aromatase inhibitors: Secondary | ICD-10-CM | POA: Diagnosis not present

## 2016-07-22 DIAGNOSIS — Z1211 Encounter for screening for malignant neoplasm of colon: Secondary | ICD-10-CM

## 2016-07-22 LAB — COMPREHENSIVE METABOLIC PANEL
ALBUMIN: 3.8 g/dL (ref 3.5–5.0)
ALK PHOS: 60 U/L (ref 40–150)
ALT: 29 U/L (ref 0–55)
AST: 20 U/L (ref 5–34)
Anion Gap: 12 mEq/L — ABNORMAL HIGH (ref 3–11)
BUN: 14.9 mg/dL (ref 7.0–26.0)
CALCIUM: 9.5 mg/dL (ref 8.4–10.4)
CO2: 24 mEq/L (ref 22–29)
Chloride: 104 mEq/L (ref 98–109)
Creatinine: 1.1 mg/dL (ref 0.6–1.1)
EGFR: 55 mL/min/{1.73_m2} — AB (ref >90)
Glucose: 254 mg/dl — ABNORMAL HIGH (ref 70–140)
POTASSIUM: 4.3 meq/L (ref 3.5–5.1)
Sodium: 140 mEq/L (ref 136–145)
Total Bilirubin: 0.6 mg/dL (ref 0.20–1.20)
Total Protein: 7.1 g/dL (ref 6.4–8.3)

## 2016-07-22 LAB — CBC WITH DIFFERENTIAL/PLATELET
BASO%: 0.4 % (ref 0.0–2.0)
BASOS ABS: 0 10*3/uL (ref 0.0–0.1)
EOS ABS: 1.1 10*3/uL — AB (ref 0.0–0.5)
EOS%: 13 % — ABNORMAL HIGH (ref 0.0–7.0)
HEMATOCRIT: 37.1 % (ref 34.8–46.6)
HEMOGLOBIN: 11.2 g/dL — AB (ref 11.6–15.9)
LYMPH#: 3 10*3/uL (ref 0.9–3.3)
LYMPH%: 36.9 % (ref 14.0–49.7)
MCH: 23 pg — AB (ref 25.1–34.0)
MCHC: 30.2 g/dL — ABNORMAL LOW (ref 31.5–36.0)
MCV: 76.3 fL — AB (ref 79.5–101.0)
MONO#: 0.4 10*3/uL (ref 0.1–0.9)
MONO%: 5.3 % (ref 0.0–14.0)
NEUT#: 3.6 10*3/uL (ref 1.5–6.5)
NEUT%: 44.4 % (ref 38.4–76.8)
Platelets: 260 10*3/uL (ref 145–400)
RBC: 4.86 10*6/uL (ref 3.70–5.45)
RDW: 13.4 % (ref 11.2–14.5)
WBC: 8.1 10*3/uL (ref 3.9–10.3)

## 2016-07-22 NOTE — Telephone Encounter (Signed)
Lab and follow up appointments with Dr Burr Medico was scheduled for 4 months, per 07/22/16 los. Patient was given a copy of the AVS report and appointment schedule, per 07/22/16 los.

## 2016-07-22 NOTE — Telephone Encounter (Signed)
Please schedule an appt for the patient with me within a month for medication recheck.

## 2016-07-27 ENCOUNTER — Other Ambulatory Visit: Payer: Self-pay | Admitting: Hematology

## 2016-07-27 DIAGNOSIS — E559 Vitamin D deficiency, unspecified: Secondary | ICD-10-CM

## 2016-07-27 DIAGNOSIS — D509 Iron deficiency anemia, unspecified: Secondary | ICD-10-CM

## 2016-08-05 ENCOUNTER — Other Ambulatory Visit: Payer: Self-pay | Admitting: Physician Assistant

## 2016-08-05 DIAGNOSIS — E119 Type 2 diabetes mellitus without complications: Secondary | ICD-10-CM

## 2016-08-07 NOTE — Telephone Encounter (Signed)
Please make an appt with me within a month

## 2016-08-15 ENCOUNTER — Other Ambulatory Visit: Payer: Self-pay | Admitting: Physician Assistant

## 2016-08-15 DIAGNOSIS — I10 Essential (primary) hypertension: Secondary | ICD-10-CM

## 2016-08-16 ENCOUNTER — Ambulatory Visit (INDEPENDENT_AMBULATORY_CARE_PROVIDER_SITE_OTHER): Payer: Medicare Other | Admitting: Physician Assistant

## 2016-08-16 ENCOUNTER — Encounter: Payer: Self-pay | Admitting: Physician Assistant

## 2016-08-16 VITALS — BP 144/67 | HR 80 | Temp 98.5°F | Resp 18 | Ht <= 58 in | Wt 130.0 lb

## 2016-08-16 DIAGNOSIS — E7800 Pure hypercholesterolemia, unspecified: Secondary | ICD-10-CM

## 2016-08-16 DIAGNOSIS — M810 Age-related osteoporosis without current pathological fracture: Secondary | ICD-10-CM | POA: Diagnosis not present

## 2016-08-16 DIAGNOSIS — Z23 Encounter for immunization: Secondary | ICD-10-CM

## 2016-08-16 DIAGNOSIS — I10 Essential (primary) hypertension: Secondary | ICD-10-CM | POA: Diagnosis not present

## 2016-08-16 DIAGNOSIS — Z780 Asymptomatic menopausal state: Secondary | ICD-10-CM | POA: Diagnosis not present

## 2016-08-16 DIAGNOSIS — E559 Vitamin D deficiency, unspecified: Secondary | ICD-10-CM | POA: Diagnosis not present

## 2016-08-16 DIAGNOSIS — E119 Type 2 diabetes mellitus without complications: Secondary | ICD-10-CM

## 2016-08-16 MED ORDER — VITAMIN D3 50 MCG (2000 UT) PO TABS: 1.0000 | ORAL_TABLET | Freq: Every day | ORAL | 2 refills | Status: DC

## 2016-08-16 MED ORDER — SIMVASTATIN 20 MG PO TABS: ORAL_TABLET | ORAL | 0 refills | Status: DC

## 2016-08-16 MED ORDER — METOPROLOL SUCCINATE ER 50 MG PO TB24: ORAL_TABLET | ORAL | 1 refills | Status: DC

## 2016-08-16 MED ORDER — METFORMIN HCL 500 MG PO TABS: ORAL_TABLET | ORAL | 1 refills | Status: DC

## 2016-08-16 MED ORDER — ALENDRONATE SODIUM 35 MG PO TABS: 35.0000 mg | ORAL_TABLET | ORAL | 4 refills | Status: DC

## 2016-08-16 NOTE — Patient Instructions (Addendum)
To Do list Make an eye dr appt for them to make sure your diabetes has not affected your eyes. Do the stool collection at home and then mail back to the company.    IF you received an x-ray today, you will receive an invoice from [facility]. Please contact [facility] at [phone] with questions or concerns regarding your invoice.   IF you received labwork today, you will receive an invoice from [place]. Please contact [facility] at [phone] with questions or concerns regarding your invoice.   Our billing staff will not be able to assist you with questions regarding bills from these companies.  You will be contacted with the lab results as soon as they are available. The fastest way to get your results is to activate your My Chart account. Instructions are located on the last page of this paperwork. If you have not heard from [place] regarding the results in 2 weeks, please contact this office.

## 2016-08-16 NOTE — Progress Notes (Signed)
[name]  MRN: [id number] DOB: 1955-04-23  PCP: Mancel Bale, PA-C  Chief Complaint  Patient presents with  . Medication Refill    all meds     Subjective:  Pt presents to clinic for medication refill.  She has been out of medication for a couple of days.  She does not take her BP or glucose at home.  She feels good.  She works in her garden in the am and pm when it is cooler and she were long sleeves.  Review of Systems  Constitutional: Negative for chills and fever.  Respiratory: Negative for shortness of breath.   Cardiovascular: Negative for chest pain, palpitations and leg swelling.  Skin: Negative for wound.  Neurological: Negative for numbness and headaches.    Patient Active Problem List   Diagnosis Date Noted  . HTN (hypertension) 08/30/2015  . Elevated cholesterol 08/30/2015  . Osteoporosis 08/30/2015  . Heartburn 08/30/2015  . Diabetes type 2, controlled ([place]) 08/30/2015  . Breast cancer of upper-outer quadrant of right female breast ([place]) 08/27/2010    Current Outpatient Prescriptions on File Prior to Visit  Medication Sig Dispense Refill  . anastrozole (ARIMIDEX) 1 MG tablet TAKE 1 TABLET BY MOUTH DAILY 90 tablet 1  . Multiple Vitamin (MULITIVITAMIN WITH MINERALS) TABS Take 2 tablets by mouth daily. Reported on 08/30/2015     No current facility-administered medications on file prior to visit.     Allergies  Allergen Reactions  . Asa Buff (Mag [Buffered Aspirin] Nausea And Vomiting    Pt patients past, family and social history were reviewed and updated.   Objective:  BP (!) 144/67   Pulse 80   Temp 98.5 F (36.9 C) (Oral)   Resp 18   Ht 4\' 7"  (1.397 m)   Wt 130 lb (59 kg)   SpO2 98%   BMI 30.21 kg/m   Physical Exam  Constitutional: She is oriented to person, place, and time and well-developed, well-nourished, and in no distress.  HENT:  Head: Normocephalic and atraumatic.  Right Ear: Hearing and external ear normal.  Left Ear:  Hearing and external ear normal.  Eyes: Conjunctivae are normal.  Neck: Normal range of motion.  Cardiovascular: Normal rate, regular rhythm and normal heart sounds.   No murmur heard. Pulmonary/Chest: Effort normal and breath sounds normal.  Musculoskeletal:       Right lower leg: She exhibits no edema.       Left lower leg: She exhibits no edema.  Neurological: She is alert and oriented to person, place, and time. Gait normal.  Skin: Skin is warm and dry.  Psychiatric: Mood, memory, affect and judgment normal.  Vitals reviewed.   Assessment and Plan :  Essential hypertension - Plan: metoprolol succinate (TOPROL-XL) 50 MG 24 hr tablet - pt has been out of her medication for a few days - except her BP to be better once back on the medication but if it does not I would rather have her systolic a little higher since her diastolic is so low on the medications.  Controlled type 2 diabetes mellitus without complication, without long-term current use of insulin (HCC) - Plan: Hemoglobin A1c, simvastatin (ZOCOR) 20 MG tablet, metFORMIN (GLUCOPHAGE) 500 MG tablet- check labs and continue medications  Elevated cholesterol - Plan: Lipid panel  Age-related osteoporosis without current pathological fracture - Plan: VITAMIN D 25 Hydroxy (Vit-D Deficiency, Fractures), alendronate (FOSAMAX) 35 MG tablet, Cholecalciferol (VITAMIN D3) 2000 units TABS  Need for pneumococcal vaccination -  Plan: Pneumococcal polysaccharide vaccine 23-valent greater than or equal to 2yo subcutaneous/IM  Post-menopausal - Plan: VITAMIN D 25 Hydroxy (Vit-D Deficiency, Fractures)  Vitamin D deficiency - Plan: VITAMIN D 25 Hydroxy (Vit-D Deficiency, Fractures)  Pt needs a pap and a medicare wellness - she will have this done in 3 months at her f/u appt.  Windell Hummingbird PA-C  [facility] 08/16/2016 8:41 AM

## 2016-08-18 LAB — LIPID PANEL
CHOL/HDL RATIO: 3.8 ratio (ref 0.0–4.4)
Cholesterol, Total: 139 mg/dL (ref 100–199)
HDL: 37 mg/dL — AB (ref >39)
LDL Calculated: 66 mg/dL (ref 0–99)
Triglycerides: 178 mg/dL — ABNORMAL HIGH (ref 0–149)
VLDL Cholesterol Cal: 36 mg/dL (ref 5–40)

## 2016-08-18 LAB — HEMOGLOBIN A1C
ESTIMATED AVERAGE GLUCOSE: 140 mg/dL
HEMOGLOBIN A1C: 6.5 % — AB (ref 4.8–5.6)

## 2016-08-18 LAB — VITAMIN D 25 HYDROXY (VIT D DEFICIENCY, FRACTURES): VIT D 25 HYDROXY: 44.9 ng/mL (ref 30.0–100.0)

## 2016-08-20 ENCOUNTER — Other Ambulatory Visit: Payer: Medicare Other

## 2016-09-02 ENCOUNTER — Ambulatory Visit
Admission: RE | Admit: 2016-09-02 | Discharge: 2016-09-02 | Disposition: A | Discharge status: Home / Self Care | Payer: Medicare Other | Source: Ambulatory Visit | Attending: Hematology | Admitting: Hematology

## 2016-09-02 DIAGNOSIS — E2839 Other primary ovarian failure: Secondary | ICD-10-CM

## 2016-09-02 DIAGNOSIS — Z780 Asymptomatic menopausal state: Secondary | ICD-10-CM | POA: Diagnosis not present

## 2016-09-02 DIAGNOSIS — M810 Age-related osteoporosis without current pathological fracture: Secondary | ICD-10-CM | POA: Diagnosis not present

## 2016-09-18 ENCOUNTER — Other Ambulatory Visit: Payer: Self-pay | Admitting: Hematology

## 2016-09-18 DIAGNOSIS — Z1231 Encounter for screening mammogram for malignant neoplasm of breast: Secondary | ICD-10-CM

## 2016-09-27 DIAGNOSIS — H40033 Anatomical narrow angle, bilateral: Secondary | ICD-10-CM | POA: Diagnosis not present

## 2016-09-27 DIAGNOSIS — E119 Type 2 diabetes mellitus without complications: Secondary | ICD-10-CM | POA: Diagnosis not present

## 2016-11-12 ENCOUNTER — Other Ambulatory Visit: Payer: Self-pay | Admitting: Physician Assistant

## 2016-11-12 DIAGNOSIS — I10 Essential (primary) hypertension: Secondary | ICD-10-CM

## 2016-11-12 DIAGNOSIS — E119 Type 2 diabetes mellitus without complications: Secondary | ICD-10-CM

## 2016-11-18 ENCOUNTER — Ambulatory Visit (INDEPENDENT_AMBULATORY_CARE_PROVIDER_SITE_OTHER): Payer: Medicare Other | Admitting: Physician Assistant

## 2016-11-18 ENCOUNTER — Encounter: Payer: Self-pay | Admitting: Physician Assistant

## 2016-11-18 VITALS — BP 128/72 | HR 74 | Temp 98.0°F | Resp 18 | Ht <= 58 in | Wt 127.8 lb

## 2016-11-18 DIAGNOSIS — R079 Chest pain, unspecified: Secondary | ICD-10-CM

## 2016-11-18 DIAGNOSIS — E119 Type 2 diabetes mellitus without complications: Secondary | ICD-10-CM | POA: Diagnosis not present

## 2016-11-18 DIAGNOSIS — C50411 Malignant neoplasm of upper-outer quadrant of right female breast: Secondary | ICD-10-CM | POA: Diagnosis not present

## 2016-11-18 DIAGNOSIS — I10 Essential (primary) hypertension: Secondary | ICD-10-CM | POA: Diagnosis not present

## 2016-11-18 DIAGNOSIS — M810 Age-related osteoporosis without current pathological fracture: Secondary | ICD-10-CM

## 2016-11-18 DIAGNOSIS — Z170 Estrogen receptor positive status [ER+]: Secondary | ICD-10-CM | POA: Diagnosis not present

## 2016-11-18 DIAGNOSIS — Z01419 Encounter for gynecological examination (general) (routine) without abnormal findings: Secondary | ICD-10-CM

## 2016-11-18 DIAGNOSIS — Z23 Encounter for immunization: Secondary | ICD-10-CM

## 2016-11-18 DIAGNOSIS — Z0000 Encounter for general adult medical examination without abnormal findings: Secondary | ICD-10-CM

## 2016-11-18 DIAGNOSIS — E7800 Pure hypercholesterolemia, unspecified: Secondary | ICD-10-CM | POA: Diagnosis not present

## 2016-11-18 DIAGNOSIS — Z1211 Encounter for screening for malignant neoplasm of colon: Secondary | ICD-10-CM

## 2016-11-18 NOTE — Patient Instructions (Addendum)
IF you received an x-ray today, you will receive an invoice from [facility]. Please contact [facility] at [phone] with questions or concerns regarding your invoice.   IF you received labwork today, you will receive an invoice from [place]. Please contact [facility] at [phone] with questions or concerns regarding your invoice.   Our billing staff will not be able to assist you with questions regarding bills from these companies.  You will be contacted with the lab results as soon as they are available. The fastest way to get your results is to activate your My Chart account. Instructions are located on the last page of this paperwork. If you have not heard from [place] regarding the results in 2 weeks, please contact this office.     Advance Directive Advance directives are legal documents that let you make choices ahead of time about your health care and medical treatment in case you become unable to communicate for yourself. Advance directives are a way for you to communicate your wishes to family, friends, and health care providers. This can help convey your decisions about end-of-life care if you become unable to communicate. Discussing and writing advance directives should happen over time rather than all at once. Advance directives can be changed depending on your situation and what you want, even after you have signed the advance directives. If you do not have an advance directive, some states assign family decision makers to act on your behalf based on how closely you are related to them. Each state has its own laws regarding advance directives. You may want to check with your health care provider, attorney, or state representative about the laws in your state. There are different types of advance directives, such as:  Medical power of attorney.  Living will.  Do not resuscitate (DNR) or do not attempt resuscitation (DNAR) order.  Health care proxy and  medical power of attorney A health care proxy, also called a health care agent, is a person who is appointed to make medical decisions for you in cases in which you are unable to make the decisions yourself. Generally, people choose someone they know well and trust to represent their preferences. Make sure to ask this person for an agreement to act as your proxy. A proxy may have to exercise judgment in the event of a medical decision for which your wishes are not known. A medical power of attorney is a legal document that names your health care proxy. Depending on the laws in your state, after the document is written, it may also need to be:  Signed.  Notarized.  Dated.  Copied.  Witnessed.  Incorporated into your medical record.  You may also want to appoint someone to manage your financial affairs in a situation in which you are unable to do so. This is called a durable power of attorney for finances. It is a separate legal document from the durable power of attorney for health care. You may choose the same person or someone different from your health care proxy to act as your agent in financial matters. If you do not appoint a proxy, or if there is a concern that the proxy is not acting in your best interests, a court-appointed guardian may be designated to act on your behalf. Living will A living will is a set of instructions documenting your wishes about medical care when you cannot express them yourself. Health care providers should keep a copy of your living will in  your medical record. You may want to give a copy to family members or friends. To alert caregivers in case of an emergency, you can place a card in your wallet to let them know that you have a living will and where they can find it. A living will is used if you become:  Terminally ill.  Incapacitated.  Unable to communicate or make decisions.  Items to consider in your living will include:  The use or non-use of  life-sustaining equipment, such as dialysis machines and breathing machines (ventilators).  A DNR or DNAR order, which is the instruction not to use cardiopulmonary resuscitation (CPR) if breathing or heartbeat stops.  The use or non-use of tube feeding.  Withholding of food and fluids.  Comfort (palliative) care when the goal becomes comfort rather than a cure.  Organ and tissue donation.  A living will does not give instructions for distributing your money and property if you should pass away. It is recommended that you seek the advice of a lawyer when writing a will. Decisions about taxes, beneficiaries, and asset distribution will be legally binding. This process can relieve your family and friends of any concerns surrounding disputes or questions that may come up about the distribution of your assets. DNR or DNAR A DNR or DNAR order is a request not to have CPR in the event that your heart stops beating or you stop breathing. If a DNR or DNAR order has not been made and shared, a health care provider will try to help any patient whose heart has stopped or who has stopped breathing. If you plan to have surgery, talk with your health care provider about how your DNR or DNAR order will be followed if problems occur. Summary  Advance directives are the legal documents that allow you to make choices ahead of time about your health care and medical treatment in case you become unable to communicate for yourself.  The process of discussing and writing advance directives should happen over time. You can change the advance directives, even after you have signed them.  Advance directives include DNR or DNAR orders, living wills, and designating an agent as your medical power of attorney. This information is not intended to replace advice given to you by your health care provider. Make sure you discuss any questions you have with your health care provider. Document Released: 05/20/2007 Document  Revised: 12/31/2015 Document Reviewed: 12/31/2015 Elsevier Interactive Patient Education  2017 [employer].

## 2016-11-18 NOTE — Progress Notes (Signed)
Presents today for Medicare Visit.   Interpreter used for this visit? Yes - daughter  Other items to address today:  HTN - Medication refill Chest pain - she has it every day - but it will completely resolve at times - no diaphoresis or nausea - the pain is not sharp and does not get worse with movement of arms - when she has it she does not feel like she has to burp - no indigestion or heartburn sensation - activity does not make it worse - she has used no medication for the discomfort  Cancer Screening: Cervical (every2 years - ages 21-65): no - been a long time Breast (annually - ages 84-75): yes - last year - schedule 10/9 Colon (every 10 years - ages 7-75): no - wants to do cologuard   Other screening: Vit D - June 2018 - normal - for osteoporosis - 7/18 - last bonedensity ETOH use: no Dental visits: long time Vision visits: last month - good exam Typical Meals: 2-3 meals per day - some snacking - no problems with affording meals Typical Beverages: water Exercise: works in the garden  Lab Screening: Last screening for diabetes (annually): June - diabetes Last lipid screening (every 5 years): June - on medications   ADVANCE DIRECTIVES:  Discussed: yes On File: no Materials Provided: yes Patient desires - information given and they will discuss  Depression screen [facility] 2/9 11/18/2016 08/16/2016 12/01/2015 09/29/2015 08/30/2015  Decreased Interest 0 0 0 0 0  Down, Depressed, Hopeless 0 0 0 0 0  PHQ - 2 Score 0 0 0 0 0    Functional Status Survey: Is the patient deaf or have difficulty hearing?: No Does the patient have difficulty seeing, even when wearing glasses/contacts?: No Does the patient have difficulty concentrating, remembering, or making decisions?: No Does the patient have difficulty walking or climbing stairs?: No Does the patient have difficulty dressing or bathing?: No Does the patient have difficulty doing errands alone such as visiting a doctor's office  or shopping?: No   Fall Risk  11/18/2016 08/16/2016 12/01/2015 09/29/2015 08/30/2015  Falls in the past year? Yes Yes No No No  Number falls in past yr: 1 1 - - -  Injury with Fall? No No - - -    Immunization status:  Immunization History  Administered Date(s) Administered  . Influenza,inj,Quad PF,6+ Mos 12/01/2015, 11/18/2016  . Pneumococcal Polysaccharide-23 08/16/2016    Health Maintenance Due  Topic Date Due  . TETANUS/TDAP  05/29/1974  . PAP SMEAR  05/28/1976  . COLONOSCOPY  05/28/2005  . HEMOGLOBIN A1C  11/16/2016    Patient Care Team: Mittie Bodo as PCP - General (Physician Assistant) Truitt Merle, MD as Consulting Physician (Hematology)   Patient Active Problem List   Diagnosis Date Noted  . HTN (hypertension) 08/30/2015  . Elevated cholesterol 08/30/2015  . Osteoporosis 08/30/2015  . Heartburn 08/30/2015  . Diabetes type 2, controlled ([place]) 08/30/2015  . Breast cancer of upper-outer quadrant of right female breast ([place]) 08/27/2010     Past Medical History:  Diagnosis Date  . Anxiety    ATIVAN HELPS PT TO SLEEP  . Cancer ([place])    breast right side -COMPLETED CHEMO WITH DR. Collier Salina RUBIN--EXCEPT ARIMIDEX  . Cough    PT HAS HAD COUGH FOR PAST MONTH-WHITE PHLEGM  . Hyperlipidemia   . Hypertension   . Indigestion    TAKING ZANTAC     Past Surgical History:  Procedure Laterality Date  .  BREAST SURGERY    . MASTECTOMY  09/12/10   right  . PORT-A-CATH REMOVAL  03/19/2011   Procedure: REMOVAL PORT-A-CATH;  Surgeon: Stark Klein, MD;  Location: [facility];  Service: General;  Laterality: N/A;  . PORTACATH PLACEMENT    . UTI  recent   pt has been taking an antibiotic for recent UTI     Family History  Problem Relation Age of Onset  . Cancer Father        unknown type cancer      Social History   Social History  . Marital status: Single    Spouse name: N/A  . Number of children: N/A  . Years of education: N/A   Occupational History  . Not on  file.   Social History Main Topics  . Smoking status: Never Smoker  . Smokeless tobacco: Never Used  . Alcohol use No  . Drug use: No  . Sexual activity: Not on file   Other Topics Concern  . Not on file   Social History Narrative   [facility] [employer]    Lives with Daughter     Allergies  Allergen Reactions  . Asa Buff (Mag [Buffered Aspirin] Nausea And Vomiting    Prior to Admission medications   Medication Sig Start Date End Date Taking? Authorizing Provider  alendronate (FOSAMAX) 35 MG tablet Take 1 tablet (35 mg total) by mouth every 7 (seven) days. Take with a full glass of water on an empty stomach. 08/16/16  Yes Shirlee Whitmire L, PA-C  anastrozole (ARIMIDEX) 1 MG tablet TAKE 1 TABLET BY MOUTH DAILY 07/11/16  Yes Truitt Merle, MD  Cholecalciferol (VITAMIN D3) 2000 units TABS Take 1 tablet by mouth daily. 08/16/16  Yes Kreg Earhart L, PA-C  metFORMIN (GLUCOPHAGE) 500 MG tablet TAKE 1 TABLET(500 MG) BY MOUTH TWICE DAILY WITH A MEAL 11/12/16  Yes Keiffer Piper L, PA-C  metoprolol succinate (TOPROL-XL) 50 MG 24 hr tablet TAKE 1 TABLET DAILY WITH OR IMMEDIATELY FOLLOWING MEAL 11/12/16  Yes Arlester Keehan L, PA-C  Multiple Vitamin (MULITIVITAMIN WITH MINERALS) TABS Take 2 tablets by mouth daily. Reported on 08/30/2015   Yes [provider]  simvastatin (ZOCOR) 20 MG tablet TAKE 1 TABLET BY MOUTH AT BEDTIME 11/12/16  Yes Cordney Barstow, Damaris Hippo, PA-C    PHYSICAL EXAM: BP 128/72   Pulse 74   Temp 98 F (36.7 C) (Oral)   Resp 18   Ht '4\' 8"'  (1.422 m)   Wt 127 lb 12.8 oz (58 kg)   SpO2 97%   BMI 28.65 kg/m   Wt Readings from Last 3 Encounters:  11/18/16 127 lb 12.8 oz (58 kg)  08/16/16 130 lb (59 kg)  07/22/16 130 lb (59 kg)     No exam data present  Physical Exam  Constitutional: She is oriented to person, place, and time. She appears well-developed and well-nourished.  HENT:  Head: Normocephalic and atraumatic.  Right Ear: External ear normal.  Left Ear: External ear normal.  Nose:  Nose normal.  Mouth/Throat: Oropharynx is clear and moist.  Eyes: Pupils are equal, round, and reactive to light. Conjunctivae and EOM are normal.  Neck: Normal range of motion. Neck supple. Carotid bruit is not present. No thyroid mass and no thyromegaly present.  Cardiovascular: Normal rate, regular rhythm and normal heart sounds.   No murmur heard. Respiratory: Effort normal and breath sounds normal. She has no wheezes. Left breast exhibits no inverted nipple, no mass, no nipple discharge, no skin change and no  tenderness. Breasts are symmetrical.  mastectomy scar on the right - medial aspect of scar is tender  GI: Soft. Bowel sounds are normal.  Musculoskeletal: Normal range of motion.  Neurological: She is alert and oriented to person, place, and time.  Skin: Skin is warm and dry.  Psychiatric: She has a normal mood and affect. Her behavior is normal. Judgment and thought content normal.   Education/Counseling: yes diet and exercise yes prevention of chronic diseases yes smoking/tobacco cessation yes review "Covered Medicare Preventive Services"  ASSESSMENT/PLAN: Medicare annual wellness visit, initial  Need for influenza vaccination - Plan: Flu Vaccine QUAD 36+ mos IM  Screen for colon cancer - Plan: Cologuard  Essential hypertension - Plan: CMP14+EGFR, Ambulatory referral to Cardiology  Controlled type 2 diabetes mellitus without complication, without long-term current use of insulin ([place]) - Plan: Hemoglobin A1c, CMP14+EGFR, Ambulatory referral to Cardiology - check labs - adjust mediations as needed  Malignant neoplasm of upper-outer quadrant of right breast in female, estrogen receptor positive ([place]) - contin f/u with oncology  Elevated cholesterol - Plan: CMP14+EGFR, Ambulatory referral to Cardiology - check labs  Chest pain, unspecified type - Plan: Ambulatory referral to Cardiology - unsure of cause but with age, Dm and HTN refer for possible cardiolyte  testing  Encounter for gynecological examination without abnormal finding - Plan: Pap IG and HPV (high risk) DNA detection  Windell Hummingbird PA-C  [facility] 11/18/2016 10:53 AM

## 2016-11-19 ENCOUNTER — Ambulatory Visit (INDEPENDENT_AMBULATORY_CARE_PROVIDER_SITE_OTHER): Payer: Medicare Other | Admitting: Cardiology

## 2016-11-19 ENCOUNTER — Encounter: Payer: Self-pay | Admitting: Cardiology

## 2016-11-19 VITALS — BP 126/64 | HR 85 | Ht <= 58 in | Wt 128.4 lb

## 2016-11-19 DIAGNOSIS — E7800 Pure hypercholesterolemia, unspecified: Secondary | ICD-10-CM

## 2016-11-19 DIAGNOSIS — R079 Chest pain, unspecified: Secondary | ICD-10-CM | POA: Insufficient documentation

## 2016-11-19 DIAGNOSIS — I10 Essential (primary) hypertension: Secondary | ICD-10-CM

## 2016-11-19 DIAGNOSIS — R011 Cardiac murmur, unspecified: Secondary | ICD-10-CM | POA: Diagnosis not present

## 2016-11-19 DIAGNOSIS — R002 Palpitations: Secondary | ICD-10-CM | POA: Diagnosis not present

## 2016-11-19 LAB — CMP14+EGFR
A/G RATIO: 1.7 (ref 1.2–2.2)
ALBUMIN: 4.7 g/dL (ref 3.6–4.8)
ALK PHOS: 59 IU/L (ref 39–117)
ALT: 33 IU/L — ABNORMAL HIGH (ref 0–32)
AST: 26 IU/L (ref 0–40)
BUN / CREAT RATIO: 20 (ref 12–28)
BUN: 17 mg/dL (ref 8–27)
Bilirubin Total: 0.7 mg/dL (ref 0.0–1.2)
CALCIUM: 10.3 mg/dL (ref 8.7–10.3)
CO2: 25 mmol/L (ref 20–29)
Chloride: 102 mmol/L (ref 96–106)
Creatinine, Ser: 0.86 mg/dL (ref 0.57–1.00)
GFR calc Af Amer: 84 mL/min/{1.73_m2} (ref >59)
GFR, EST NON AFRICAN AMERICAN: 73 mL/min/{1.73_m2} (ref >59)
GLOBULIN, TOTAL: 2.7 g/dL (ref 1.5–4.5)
Glucose: 134 mg/dL — ABNORMAL HIGH (ref 65–99)
POTASSIUM: 4.7 mmol/L (ref 3.5–5.2)
SODIUM: 143 mmol/L (ref 134–144)
Total Protein: 7.4 g/dL (ref 6.0–8.5)

## 2016-11-19 LAB — HEMOGLOBIN A1C
Est. average glucose Bld gHb Est-mCnc: 140 mg/dL
HEMOGLOBIN A1C: 6.5 % — AB (ref 4.8–5.6)

## 2016-11-19 NOTE — Patient Instructions (Signed)
SCHEDULE AT [place] has requested that you have an echocardiogram. Echocardiography is a painless test that uses sound waves to create images of your heart. It provides your doctor with information about the size and shape of your heart and how well your heart's chambers and valves are working. This procedure takes approximately one hour. There are no restrictions for this procedure.   DAY OF STRESS ECHO - DO NOT TAKE METOPROLOL UNTIL TEST IS COMPLETED. Your physician has requested that you have a stress echocardiogram. For further information please visit [email or web address]. Please follow instruction sheet as given.  Your physician has recommended that you wear a holter monitor 48 HOUR . Holter monitors are medical devices that record the heart's electrical activity. Doctors most often use these monitors to diagnose arrhythmias. Arrhythmias are problems with the speed or rhythm of the heartbeat. The monitor is a small, portable device. You can wear one while you do your normal daily activities. This is usually used to diagnose what is causing palpitations/syncope (passing out).   Your physician recommends that you schedule a follow-up appointment in 2 [place].

## 2016-11-19 NOTE — Progress Notes (Signed)
PCP: Sabrina Bale, PA-C  Clinic Note: Chief Complaint  Patient presents with  . New Patient (Initial Visit)    chest pain, rapid HR    HPI: [name] is a 61 y.o. female who is being seen today for the evaluation of chest pain that occurs every day at the request of Weber, Damaris Hippo, PA-C.  She is [place] - with minimal [place].  Her daughter is here today serving as an Astronomer. She has diagnosis of hypertension, hyperlipidemia controlled type 2 diabetes.  [name] was seen by her PCP yesterday with complaints every day chest pain. Chest pain is not exacerbated by eating or any particular activity.  Recent Hospitalizations: None  Studies Personally Reviewed - (if available, images/films reviewed: From Epic Chart or Care Everywhere)  None  Interval History: She presents to discuss 2 separate symptoms that occur off & on throughout the day & are not necessarily associated with any particular activity -- she states that they happen really throughout the day.  These symptoms seem to have started ~2-3 weeks ago & have started worrying her.  #1 - she notes intermittent spells lasting ~2-3 min of chest tightness / squeezing that usually occurs when she lies down to rest or is just sitting / standing around. ~4-5/10 at worst.  Resolve spontaneously with no attempt to alleviate (don't last long enough). May happen up to 4 times a day, but usually only 2-3 times. Not exacerbated by deep breathing, coughing or activity/exercise.  #2 - intermittent spells of increased HR that seem to be regular rhythm in nature & last up to ~5 min.  These episodes (unlike the chest tightness) are associated with shortness of breath & do not co-occur with the CP.  Despite having the rapid heartbeats, she denies any lightheadedness or dizziness associated with it. No syncope/near syncope or TIA/amaurosis fugax symptoms. No PND, orthopnea or edema.   No claudication. However she does note that  sometimes when she walks she has a shooting pain from her right back/buttock down to her knee. This usually occurs after she is walking, or when she first starts to walk but is not made worse with walking. Does describe sort of his electrical shooting symptom.  ROS: A comprehensive was performed. Review of Systems  Constitutional: Negative for malaise/fatigue.  HENT: Negative.  Negative for congestion and nosebleeds.   Respiratory: Negative for cough and wheezing.   Cardiovascular:       Per history of present illness  Gastrointestinal: Negative for abdominal pain, blood in stool, heartburn and melena.  Genitourinary: Negative for hematuria.  Musculoskeletal: Positive for joint pain (Mild arthritis pains in her hands.).  Skin: Negative.   Neurological: Negative for dizziness and focal weakness.  Endo/Heme/Allergies: Negative for environmental allergies.  Psychiatric/Behavioral: Negative for depression and memory loss. The patient is nervous/anxious. The patient does not have insomnia.   All other systems reviewed and are negative.   I have reviewed and (if needed) personally updated the patient's problem list, medications, allergies, past medical and surgical history, social and family history.   Past Medical History:  Diagnosis Date  . Anxiety    ATIVAN HELPS PT TO SLEEP  . Breast cancer ([place])    breast right side -COMPLETED CHEMO WITH DR. Collier Salina RUBIN--EXCEPT ARIMIDEX  . Cough    PT HAS HAD COUGH FOR PAST MONTH-WHITE PHLEGM  . Hyperlipidemia   . Hypertension   . Indigestion    TAKING ZANTAC    Past Surgical History:  Procedure  Laterality Date  . MASTECTOMY  09/12/10   right  . PORT-A-CATH REMOVAL  03/19/2011   Procedure: REMOVAL PORT-A-CATH;  Surgeon: Stark Klein, MD;  Location: [facility];  Service: General;  Laterality: N/A;  . PORTACATH PLACEMENT      Current Meds  Medication Sig  . alendronate (FOSAMAX) 35 MG tablet Take 1 tablet (35 mg total) by mouth every 7 (seven)  days. Take with a full glass of water on an empty stomach.  Marland Kitchen anastrozole (ARIMIDEX) 1 MG tablet TAKE 1 TABLET BY MOUTH DAILY  . Cholecalciferol (VITAMIN D3) 2000 units TABS Take 1 tablet by mouth daily.  . metFORMIN (GLUCOPHAGE) 500 MG tablet TAKE 1 TABLET(500 MG) BY MOUTH TWICE DAILY WITH A MEAL  . metoprolol succinate (TOPROL-XL) 50 MG 24 hr tablet TAKE 1 TABLET DAILY WITH OR IMMEDIATELY FOLLOWING MEAL  . Multiple Vitamin (MULITIVITAMIN WITH MINERALS) TABS Take 2 tablets by mouth daily. Reported on 08/30/2015  . simvastatin (ZOCOR) 20 MG tablet TAKE 1 TABLET BY MOUTH AT BEDTIME    Allergies  Allergen Reactions  . Asa Buff (Mag [Buffered Aspirin] Nausea And Vomiting    Social History   Social History  . Marital status: Single    Spouse name: N/A  . Number of children: N/A  . Years of education: N/A   Social History Main Topics  . Smoking status: Never Smoker  . Smokeless tobacco: Never Used  . Alcohol use No  . Drug use: No  . Sexual activity: Not Currently   Other Topics Concern  . None   Social History Narrative   Widowed for over 20 years. She has 4 children and 8 grandchildren. She lives with one of her daughters. She enjoys gardening and other yard work. She is not currently working.   She does not do routine exercise activity, but is always active.    family history includes Cancer in her father and sister; Diabetes in her mother and sister; Hypertension in her mother and sister; Thyroid disease in her sister.  Wt Readings from Last 3 Encounters:  11/19/16 128 lb 6.4 oz (58.2 kg)  11/18/16 127 lb 12.8 oz (58 kg)  08/16/16 130 lb (59 kg)    PHYSICAL EXAM BP 126/64   Pulse 85   Ht 4\' 8"  (1.422 m)   Wt 128 lb 6.4 oz (58.2 kg)   BMI 28.79 kg/m  Physical Exam  Constitutional: She is oriented to person, place, and time. She appears well-developed and well-nourished. No distress.  Well-groomed, healthy-appearing  HENT:  Head: Normocephalic and atraumatic.    Mouth/Throat: No oropharyngeal exudate.  Eyes: Pupils are equal, round, and reactive to light. Conjunctivae and EOM are normal. No scleral icterus.  Neck: Normal range of motion. Neck supple. No hepatojugular reflux and no JVD present. Carotid bruit is not present. No tracheal deviation present.  Cardiovascular: Normal rate, regular rhythm and intact distal pulses.  Exam reveals no gallop (Cannot exclude a soft S4 gallop versus split S2) and no friction rub.   Murmur heard.  Medium-pitched harsh crescendo-decrescendo early systolic murmur is present with a grade of 1/6  at the upper right sternal border Pulmonary/Chest: Effort normal and breath sounds normal. No respiratory distress. She has no wheezes. She has no rales. She exhibits no tenderness.  Abdominal: Soft. Bowel sounds are normal. She exhibits no distension. There is no tenderness. There is no rebound.  Musculoskeletal: Normal range of motion. She exhibits no edema or deformity.  Neurological: She is alert and oriented  to person, place, and time. No cranial nerve deficit. She exhibits normal muscle tone.  Skin: Skin is warm and dry. No erythema.  Psychiatric: She has a normal mood and affect. Her behavior is normal. Judgment and thought content normal.  Nursing note and vitals reviewed.    Adult ECG Report  Rate: 85 ;  Rhythm: normal sinus rhythm, premature atrial contractions (PAC) and Borderline left atrial enlargement. Questionable inferior ischemia with nonspecific ST and T-wave changes.;   Narrative Interpretation: Mildly abnormal EKG.   Other studies Reviewed: Additional studies/ records that were reviewed today include:  Recent Labs:    Lab Results  Component Value Date   CHOL 139 08/16/2016   HDL 37 (L) 08/16/2016   LDLCALC 66 08/16/2016   TRIG 178 (H) 08/16/2016   CHOLHDL 3.8 08/16/2016   Lab Results  Component Value Date   HGBA1C 6.5 (H) 11/18/2016    SESSMENT / PLAN: Problem List Items Addressed This Visit     Chest pain with moderate risk for cardiac etiology - Primary    The description of her chest discomfort is similar in some ways so classic anginal description of his chest tightness and squeezing in her chest, the only atypical factor is that it is occurring usually without activity and not made worse with with activity. This would suggest the possibility of coronary spasm if no obvious lesions are seen. Plan: We will check a Treadmill Stress Echo to evaluate for ischemia      Relevant Orders   ECHOCARDIOGRAM COMPLETE   ECHOCARDIOGRAM STRESS TEST   EKG 12-Lead   Elevated cholesterol (Chronic)   Relevant Orders   EKG 12-Lead   Essential hypertension (Chronic)   Relevant Orders   EKG 12-Lead   Rapid palpitations    She's had any symptoms pretty much every day but not guaranteed. They do have enough that we should probably catheter a few if we have her wear a monitor for 48 hours.  Symptoms don't sound long enough to be dangerous rhythms, however short episodes now being treated appropriately may prevent prolonged episodes in the future.      Relevant Orders   ECHOCARDIOGRAM COMPLETE   ECHOCARDIOGRAM STRESS TEST   HOLTER MONITOR - 48 HOUR   EKG 99-BZJI   Systolic murmur    Murmur sounds like it very well is related to the aortic valve, however cannot exclude mitral prolapse given her palpitations. Plan: Check 2-D Echocardiogram      Relevant Orders   ECHOCARDIOGRAM COMPLETE   ECHOCARDIOGRAM STRESS TEST      Current medicines are reviewed at length with the patient today. (+/- concerns) n/a The following changes have been made: n/a  Patient Instructions  SCHEDULE AT [place] has requested that you have an echocardiogram. Echocardiography is a painless test that uses sound waves to create images of your heart. It provides your doctor with information about the size and shape of your heart and how well your heart's chambers and valves  are working. This procedure takes approximately one hour. There are no restrictions for this procedure.   DAY OF STRESS ECHO - DO NOT TAKE METOPROLOL UNTIL TEST IS COMPLETED. Your physician has requested that you have a stress echocardiogram. For further information please visit [email or web address]. Please follow instruction sheet as given.  Your physician has recommended that you wear a holter monitor 48 HOUR . Holter monitors are medical devices that record the heart's electrical activity. Doctors most often use these  monitors to diagnose arrhythmias. Arrhythmias are problems with the speed or rhythm of the heartbeat. The monitor is a small, portable device. You can wear one while you do your normal daily activities. This is usually used to diagnose what is causing palpitations/syncope (passing out).   Your physician recommends that you schedule a follow-up appointment in 2 [place].    Studies Ordered:   Orders Placed This Encounter  Procedures  . HOLTER MONITOR - 48 HOUR  . EKG 12-Lead  . ECHOCARDIOGRAM COMPLETE  . ECHOCARDIOGRAM STRESS TEST      Glenetta Hew, M.D., M.S. Interventional Cardiologist   Pager # [phone] Phone # [phone] [address] [place], [place] [address]

## 2016-11-20 LAB — PAP IG AND HPV HIGH-RISK
HPV, HIGH-RISK: NEGATIVE
PAP SMEAR COMMENT: 0

## 2016-11-21 ENCOUNTER — Encounter: Payer: Self-pay | Admitting: Cardiology

## 2016-11-21 NOTE — Assessment & Plan Note (Signed)
The description of her chest discomfort is similar in some ways so classic anginal description of his chest tightness and squeezing in her chest, the only atypical factor is that it is occurring usually without activity and not made worse with with activity. This would suggest the possibility of coronary spasm if no obvious lesions are seen. Plan: We will check a Treadmill Stress Echo to evaluate for ischemia

## 2016-11-21 NOTE — Assessment & Plan Note (Signed)
Murmur sounds like it very well is related to the aortic valve, however cannot exclude mitral prolapse given her palpitations. Plan: Check 2-D Echocardiogram

## 2016-11-21 NOTE — Assessment & Plan Note (Signed)
She's had any symptoms pretty much every day but not guaranteed. They do have enough that we should probably catheter a few if we have her wear a monitor for 48 hours.  Symptoms don't sound long enough to be dangerous rhythms, however short episodes now being treated appropriately may prevent prolonged episodes in the future.

## 2016-12-02 ENCOUNTER — Ambulatory Visit
Admission: RE | Admit: 2016-12-02 | Discharge: 2016-12-02 | Disposition: A | Discharge status: Home / Self Care | Payer: Medicare Other | Source: Ambulatory Visit | Attending: Hematology | Admitting: Hematology

## 2016-12-02 DIAGNOSIS — Z1231 Encounter for screening mammogram for malignant neoplasm of breast: Secondary | ICD-10-CM

## 2016-12-02 HISTORY — DX: Personal history of antineoplastic chemotherapy: Z92.21

## 2016-12-04 ENCOUNTER — Other Ambulatory Visit (HOSPITAL_COMMUNITY): Payer: Medicare Other

## 2016-12-10 ENCOUNTER — Telehealth: Payer: Self-pay | Admitting: *Deleted

## 2016-12-10 NOTE — Telephone Encounter (Signed)
Patient follow up cancelled

## 2016-12-10 NOTE — Telephone Encounter (Signed)
-----   Message from Leonie Man, MD sent at 12/07/2016  7:16 PM EDT ----- Regarding: RE: Echocardiogram OK -  That is fine. I guess we can take her off the return visit list.   DH ----- Message ----- From: Gretta Began Sent: 12/05/2016   3:55 PM To: Leonie Man, MD Subject: Echocardiogram                                 Just an FYI. We have spoken to this patient's daughter because she does not speak english. She states the patient feels better and does not want to have any testing at this time.    Lorriane Shire

## 2016-12-17 ENCOUNTER — Telehealth (HOSPITAL_COMMUNITY): Payer: Self-pay | Admitting: Cardiology

## 2016-12-17 NOTE — Telephone Encounter (Signed)
User: Cherie Dark A Date/time: 12/17/2016 10:39 AM  Comment: Called pt and spoke with dtr Good and she stated that the patient does not want the test.   Context: [place] Schedule Orders/Appt Requests Outcome: Completed  Phone number: [phone] Phone Type: Home Phone  Comm. type: Telephone Call type: Outgoing  Contact: [place],Good Relation to patient: Emergency Contact  Letter:      User: Cherie Dark A Date/time: 12/12/2016 2:04 PM  Comment: Called pt and lmsg for her to CB to r/s stress echo.Vassie Moment  Context: [place] Schedule Orders/Appt Requests Outcome: Left Message  Phone number: [phone] Phone Type: Home Phone  Comm. type: Telephone Call type: Outgoing  Contact: Fletcher Anon Relation to patient: Self  Letter:       Patient cancelled appts for 10/29

## 2016-12-22 ENCOUNTER — Other Ambulatory Visit (HOSPITAL_COMMUNITY): Payer: Medicare Other

## 2016-12-29 ENCOUNTER — Other Ambulatory Visit: Payer: Self-pay | Admitting: Hematology

## 2016-12-29 DIAGNOSIS — Z853 Personal history of malignant neoplasm of breast: Secondary | ICD-10-CM

## 2017-01-19 ENCOUNTER — Ambulatory Visit: Payer: Medicare Other | Admitting: Cardiology

## 2017-01-21 NOTE — Progress Notes (Signed)
[place]  Telephone:([phone]) [phone] Fax:([phone]) [phone]  Clinic Follow Up Note   Patient Care Team: Mittie Bodo as PCP - General (Physician Assistant) Truitt Merle, MD as Consulting Physician (Hematology) 01/22/2017   CHIEF COMPLAINTS:  Follow up right breast cancer  Oncology History   Breast cancer of upper-outer quadrant of right female breast [facility])   Staging form: Breast, AJCC 7th Edition   - Clinical stage from 04/22/2010: Stage IIB (T3, N0, M0) - Signed by Truitt Merle, MD on 01/21/2016   - Pathologic stage from 09/11/2010: Stage IA (yT1c, N0, cM0) - Signed by Truitt Merle, MD on 01/21/2016      Breast cancer of upper-outer quadrant of right female breast ([place])   04/22/2010 Initial Diagnosis    Breast cancer of upper-outer quadrant of right female breast ([place])      04/22/2010 Receptors her2     ER 50% positive, PR negative, HER-2 positive      04/22/2010 Initial Biopsy     Right  Breast mass pops A showed invasive ductal carcinoma, G3      04/25/2010 Imaging    Breast MRI showed a 7.7 cm abnormal linear enhancement and confluent masses in the upper outer quadrant of the right breast which correlate with the recently biopsied breast cancer.      05/09/2010 Imaging    PET scan was  Negative for metastatic adenopathy ordistant metastasis.      04/2010 -  Chemotherapy    She received 4 cycle of Abraxane and Herceptin every 3 weeks       09/11/2010 Surgery     Right breast mastectomy and sentinel lymph node biopsy      09/2010 -  Anti-estrogen oral therapy     Adjuvant anastrozole 1 mg daily        HISTORY OF PRESENTING ILLNESS (01/21/2016):  [name] 61 y.o. female is here because of ongoing breast cancer surveillance. 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 She underwent right breast mastectomy with Dr. Barry Dienes on 09/11/2010 which was negative for invasive malignancy or lymph node  involvement. This was found to be estrogen receptor / HER2 positive, progesterone receptor negative disease. She did not receive radiation therapy.  She saw Dr. Eston Esters for oncology care. From Dr. Julien Girt last note on 05/30/2011: "She has completed 4 cycles of neoadjuvant Abraxane/Herceptin given in combination 3 weeks on, 1 Herceptin alone on week 4.  Abraxane was then discontinued due to significant pain exacerbation in left shoulder, upper back, neck and chest region with neuropathy symptoms which have since abated while she is on gabapentin 300 mg p.o. T.i.d.".   She presents with her daughter and granddaughter. The patient does not speak English well so her daughter helped translate and facilitate our visit today. The patient moved to [place] in 2013 around July. She saw an oncologist in [place] but does not recall their name. She began Arimidex after completing chemotherapy and ran out about a month ago. She moved back to [facility] this June. She had annual breast exams while in [place]. She is not sure when her last bone density scan was. She was told to take Arimidex for 10 years.  When she first began taking medication she experienced full body pain and was switched to another medication in [place]. She continues to experience pain though it is not as much and is tolerable. She complains of pain/numbness around her right chest wall post-mastectomy. She  has chronic back pain.   She denies abdominal pain, cough, or shortness of breath. She is eating and sleeping well. She has not been diagnosed with any other major health issues or had any major surgeries recently.   Current Therapy: Anastrozole 1 mg daily starting 09/2010, plan to complete in summer 2019   INTERIM HISTORY Patient presents today for follow up. She is accompanied by her daughter who helps translate in [place] for her.  She notes no new changes since last visit. She is taking Fosamax. She is still taking  anastrozole and plans to stop next summer which will be 7 years. She is taking Vitamin D, but has not taken calcium.  She notes her right hip has been painful for some time now do to arthritis. She does not take anything for her pain. Dr. Gale Journey is her PCP, she notes having a colonoscpy 10 years ago when she did not have a PCP. She notes Dr. Gale Journey plans to have a stool card test soon for patient. She does not want to do another colonoscopy due to the process.     MEDICAL HISTORY:  Past Medical History:  Diagnosis Date  . Anxiety    ATIVAN HELPS PT TO SLEEP  . Breast cancer ([place])    breast right side -COMPLETED CHEMO WITH DR. Collier Salina RUBIN--EXCEPT ARIMIDEX  . Cough    PT HAS HAD COUGH FOR PAST MONTH-WHITE PHLEGM  . Hyperlipidemia   . Hypertension   . Indigestion    TAKING ZANTAC  . Personal history of chemotherapy 2012    SURGICAL HISTORY: Past Surgical History:  Procedure Laterality Date  . MASTECTOMY  09/12/10   right  . PORT-A-CATH REMOVAL  03/19/2011   Procedure: REMOVAL PORT-A-CATH;  Surgeon: Stark Klein, MD;  Location: [facility];  Service: General;  Laterality: N/A;  . PORTACATH PLACEMENT      SOCIAL HISTORY: Social History   Socioeconomic History  . Marital status: Single    Spouse name: Not on file  . Number of children: Not on file  . Years of education: Not on file  . Highest education level: Not on file  Social Needs  . Financial resource strain: Not on file  . Food insecurity - worry: Not on file  . Food insecurity - inability: Not on file  . Transportation needs - medical: Not on file  . Transportation needs - non-medical: Not on file  Occupational History  . Not on file  Tobacco Use  . Smoking status: Never Smoker  . Smokeless tobacco: Never Used  Substance and Sexual Activity  . Alcohol use: No  . Drug use: No  . Sexual activity: Not Currently  Other Topics Concern  . Not on file  Social History Narrative   Widowed for over 20 years. She has 4 children  and 8 grandchildren. She lives with one of her daughters. She enjoys gardening and other yard work. She is not currently working.   She does not do routine exercise activity, but is always active.    FAMILY HISTORY: Family History  Problem Relation Age of Onset  . Hypertension Mother   . Diabetes Mother   . Cancer Father        unknown type cancer   . Diabetes Sister   . Hypertension Sister   . Cancer Sister   . Thyroid disease Sister   . Breast cancer Neg Hx     ALLERGIES:  is allergic to asa buff (mag [buffered aspirin].  MEDICATIONS:  Current  Outpatient Medications  Medication Sig Dispense Refill  . alendronate (FOSAMAX) 35 MG tablet Take 1 tablet (35 mg total) by mouth every 7 (seven) days. Take with a full glass of water on an empty stomach. 12 tablet 4  . anastrozole (ARIMIDEX) 1 MG tablet Take 1 tablet (1 mg total) by mouth daily. 90 tablet 0  . Cholecalciferol (VITAMIN D3) 2000 units TABS Take 1 tablet by mouth daily. 90 tablet 2  . metFORMIN (GLUCOPHAGE) 500 MG tablet TAKE 1 TABLET(500 MG) BY MOUTH TWICE DAILY WITH A MEAL 180 tablet 0  . metoprolol succinate (TOPROL-XL) 50 MG 24 hr tablet TAKE 1 TABLET DAILY WITH OR IMMEDIATELY FOLLOWING MEAL 90 tablet 0  . Multiple Vitamin (MULITIVITAMIN WITH MINERALS) TABS Take 2 tablets by mouth daily. Reported on 08/30/2015    . simvastatin (ZOCOR) 20 MG tablet TAKE 1 TABLET BY MOUTH AT BEDTIME 90 tablet 0   No current facility-administered medications for this visit.     REVIEW OF SYSTEMS:   Constitutional: Denies fevers, chills or abnormal night sweats Eyes: Denies blurriness of vision, double vision or watery eyes Ears, nose, mouth, throat, and face: Denies mucositis or sore throat Respiratory: Denies cough, dyspnea or wheezes Cardiovascular: Denies palpitation, chest discomfort or lower extremity swelling Gastrointestinal:  Denies nausea, heartburn or change in bowel habits Skin: Denies abnormal skin rashes Lymphatics: Denies  new lymphadenopathy or easy bruising Musculoskeletal: (+) Right hip pain Neurological:Denies, tingling or new weaknesses Behavioral/Psych: Mood is stable, no new changes  All other systems were reviewed with the patient and are negative.  PHYSICAL EXAMINATION:  ECOG PERFORMANCE STATUS: 0 - Asymptomatic  Vitals:   01/22/17 0935  BP: (!) 143/82  Pulse: 74  Resp: 18  Temp: (!) 97.2 F (36.2 C)  SpO2: 100%   Filed Weights   01/22/17 0935  Weight: 130 lb 9.6 oz (59.2 kg)    GENERAL:alert, no distress and comfortable SKIN: skin color, texture, turgor are normal, no rashes or significant lesions EYES: normal, conjunctiva are pink and non-injected, sclera clear OROPHARYNX:no exudate, no erythema and lips, buccal mucosa, and tongue normal  NECK: supple, thyroid normal size, non-tender, without nodularity LYMPH:  no palpable lymphadenopathy in the cervical, axillary or inguinal LUNGS: clear to auscultation and percussion with normal breathing effort HEART: regular rate & rhythm and no murmurs and no lower extremity edema ABDOMEN:abdomen soft, non-tender and normal bowel sounds Musculoskeletal:no cyanosis of digits and no clubbing  PSYCH: alert & oriented x 3 with fluent speech NEURO: no focal motor/sensory deficits BREAST: Left breast and axillary region is without palpable abnormality or nodules. S/p right breast mastectomy with well healed surgical scar and no palpable nodularity.    LABORATORY DATA:  I have reviewed the data as listed CBC Latest Ref Rng & Units 01/22/2017 07/22/2016 12/01/2015  WBC 3.9 - 10.3 10e3/uL 7.3 8.1 7.4  Hemoglobin 11.6 - 15.9 g/dL 10.9(L) 11.2(L) 11.4(L)  Hematocrit 34.8 - 46.6 % 35.0 37.1 37.3  Platelets 145 - 400 10e3/uL 291 260 333    PATHOLOGY    RADIOGRAPHIC STUDIES: I have personally reviewed the radiological images as listed and agreed with the findings in the report.   Screening Mammogram bilateral 12/02/16 IMPRESSION: No mammographic  evidence of malignancy. A result letter of this screening mammogram will be mailed directly to the patient. RECOMMENDATION: Screening mammogram in one year.   Bone Density Scan 09/02/16 ASSESSMENT: The BMD measured at Femur Neck Right is 0.657 g/cm2 with a T-score of -2.7. This patient is  considered osteoporotic according to [facility]) criteria. There has been a statistically significant decrease in BMD of Left hip, and a statistically significant increase in BMD of Lumbar Spine since prior exam dated 03/24/2011. Site Region Measured Date Measured Age [age]A BMD Significant CHANGE T-score DualFemur Neck Right 09/02/2016    61.2         -2.7    0.657 g/cm2 AP Spine L1-L4 09/02/2016 61.2 -0.9 1.080 g/cm2 *   MR Breast bilateral w wo contrast 05/02/2010 IMPRESSION: 7.7 cm abnormal linear enhancement and confluent masses in the upper outer quadrant of the right breast which correlate with the recently biopsied breast cancer.  Diagnostic mammogram 11/29/2015 IMPRESSION: There is no persistent abnormality in the area of concern in the left breast, most likely representing normal fibroglandular tissue. RECOMMENDATION: Screening mammogram in one year.(Code:[id number]-B-01Y)  Screening mammogram 11/22/2015 IMPRESSION: Further evaluation is suggested for possible mass in the left breast. RECOMMENDATION: Diagnostic mammogram and possibly ultrasound of the left breast. (Code:[id number]-97M)  ASSESSMENT & PLAN:   1.  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 She received neoadjuvant chemotherapy with Abraxane and Herceptin,  But did not complete 1 year Herceptin due to side effects. She is at moderate to high risk for recurrence. -I previously discussed  Adjuvant endocrine therapy with the patient. We discussed  Arimidex use for 5 years versus 10 years,  And the data shows decreased recurrence with extended aromatase inhibitor.  Given her moderate to high risk of recurrence, I recommend her to continue anastrozole for 7 years. She has been tolerating well overall, agrees to continue. -She has mild arthralgia, possible related to anastrozole, manageable. No other significant side effects. -She will continue to follow with [place] for breast cancer surveillance. She will continue annual screening mammograms. - I encouraged her to have healthy diet and exercise regularly. -She is clinically doing well with her breast cancer. Labs reviewed, Hg is 10.9 and MCV is 74.3. CMP within normal limits. Her exam and 11/2016 mammogram are unremarkable. There is no clinical concern for recurrence.  -Based on previous labs she has developed anemia over the past few years. Today's iron study is still pending. If results show anemia, she will start oral iron.  -She notes to having had a colonoscopy 10 years ago. I offered to refer her to [facility] for a screening colonoscopy. She is reluctant to do it, will think about doing the procedure. Her PCP has ordered stool card for her  - continue anastrozole until summer 2019.  -Next mammogram in 11/2017, will order at next visit  -F/u in 6 months, then once a year after that.   2. Microcytic Anemia  -chronic since 2017, overall mild  -iron study and ferritin on 01/22/2017 is normal -TSH normal in 2017  -will check hemoglobin electrophoresis to rule out beta thalassemia -Continue follow-up  3. Osteoporosis  -Patient is unsure when her last bone density scan was performed -I previously spoke with the patient about the risk of decreased bone strength while taking anastrozole -She is taking calcium and Vitamin D. I previously encouraged the patient to increase physical activity to help with bone strength - Patient has tolerable joint pain in her fingers and hip pain. I encouraged her  to take Tylenol over the counter joint medication to help. She does not want to take it. -08/2016 Bone Scan  showed Osteoporosis in the right femur neck with a T-Score of -2.7. -I encouraged her to continue Vitamin D and start calcium 500-675m 1-2 times a day. I suggest chewable calcium and to increase calcium in her diet.   -I advised her to watch for falls due to her right hip being at high risk for fracture.  -She has been taking Fosamax since 2012 and has had partial response. She will continue Fosamax    4. HTN and DM - she will continue follow-up with her primary care physician  And continue medication  5. Cancer screening  -She had colonoscopy more than 10 years ago, I recommend her to have a repeat one.  She is very reluctant to have it, she will do stool card fist  Plan: - annual mammogram in October, 2019, will order on next visit  - RTC f/u in 6 months with labs -Continue anastrozole until summer of 2019, refilled today  -Start calcium along with Vitamin D -Continue Fosamax    No orders of the defined types were placed in this encounter.   All questions were answered. The patient knows to call the clinic with any problems, questions or concerns. I spent 30 minutes counseling the patient face to face. The total time spent in the appointment was 40 minutes and more than 50% was on counseling.  This document serves as a record of services personally performed by YTruitt Merle MD. It was created on her behalf by AJoslyn Devon a trained medical scribe. The creation of this record is based on the scribe's personal observations and the provider's statements to them.    I have reviewed the above documentation for accuracy and completeness, and I agree with the above.    FTruitt Merle MD 01/22/2017 5:48 PM

## 2017-01-22 ENCOUNTER — Telehealth: Payer: Self-pay | Admitting: Hematology

## 2017-01-22 ENCOUNTER — Encounter: Payer: Self-pay | Admitting: Hematology

## 2017-01-22 ENCOUNTER — Other Ambulatory Visit (HOSPITAL_BASED_OUTPATIENT_CLINIC_OR_DEPARTMENT_OTHER): Payer: Medicare Other

## 2017-01-22 ENCOUNTER — Ambulatory Visit (HOSPITAL_BASED_OUTPATIENT_CLINIC_OR_DEPARTMENT_OTHER): Payer: Medicare Other | Admitting: Hematology

## 2017-01-22 VITALS — BP 143/82 | HR 74 | Temp 97.2°F | Resp 18 | Ht <= 58 in | Wt 130.6 lb

## 2017-01-22 DIAGNOSIS — Z853 Personal history of malignant neoplasm of breast: Secondary | ICD-10-CM

## 2017-01-22 DIAGNOSIS — Z170 Estrogen receptor positive status [ER+]: Secondary | ICD-10-CM | POA: Diagnosis not present

## 2017-01-22 DIAGNOSIS — M818 Other osteoporosis without current pathological fracture: Secondary | ICD-10-CM | POA: Diagnosis not present

## 2017-01-22 DIAGNOSIS — Z79811 Long term (current) use of aromatase inhibitors: Secondary | ICD-10-CM | POA: Diagnosis not present

## 2017-01-22 DIAGNOSIS — C50411 Malignant neoplasm of upper-outer quadrant of right female breast: Secondary | ICD-10-CM | POA: Diagnosis not present

## 2017-01-22 DIAGNOSIS — D649 Anemia, unspecified: Secondary | ICD-10-CM

## 2017-01-22 DIAGNOSIS — D509 Iron deficiency anemia, unspecified: Secondary | ICD-10-CM

## 2017-01-22 LAB — IRON AND TIBC
%SAT: 27 % (ref 21–57)
IRON: 73 ug/dL (ref 41–142)
TIBC: 270 ug/dL (ref 236–444)
UIBC: 197 ug/dL (ref 120–384)

## 2017-01-22 LAB — CBC WITH DIFFERENTIAL/PLATELET
BASO%: 0.7 % (ref 0.0–2.0)
BASOS ABS: 0.1 10*3/uL (ref 0.0–0.1)
EOS ABS: 1.1 10*3/uL — AB (ref 0.0–0.5)
EOS%: 14.5 % — AB (ref 0.0–7.0)
HCT: 35 % (ref 34.8–46.6)
HEMOGLOBIN: 10.9 g/dL — AB (ref 11.6–15.9)
LYMPH%: 36.4 % (ref 14.0–49.7)
MCH: 23.2 pg — AB (ref 25.1–34.0)
MCHC: 31.2 g/dL — AB (ref 31.5–36.0)
MCV: 74.3 fL — AB (ref 79.5–101.0)
MONO#: 0.4 10*3/uL (ref 0.1–0.9)
MONO%: 6 % (ref 0.0–14.0)
NEUT#: 3.1 10*3/uL (ref 1.5–6.5)
NEUT%: 42.4 % (ref 38.4–76.8)
Platelets: 291 10*3/uL (ref 145–400)
RBC: 4.71 10*6/uL (ref 3.70–5.45)
RDW: 13.6 % (ref 11.2–14.5)
WBC: 7.3 10*3/uL (ref 3.9–10.3)
lymph#: 2.7 10*3/uL (ref 0.9–3.3)

## 2017-01-22 LAB — COMPREHENSIVE METABOLIC PANEL
ALT: 34 U/L (ref 0–55)
ANION GAP: 10 meq/L (ref 3–11)
AST: 23 U/L (ref 5–34)
Albumin: 4.1 g/dL (ref 3.5–5.0)
Alkaline Phosphatase: 61 U/L (ref 40–150)
BILIRUBIN TOTAL: 0.66 mg/dL (ref 0.20–1.20)
BUN: 16.7 mg/dL (ref 7.0–26.0)
CHLORIDE: 105 meq/L (ref 98–109)
CO2: 26 meq/L (ref 22–29)
CREATININE: 0.9 mg/dL (ref 0.6–1.1)
Calcium: 9.7 mg/dL (ref 8.4–10.4)
EGFR: >60 mL/min/{1.73_m2} (ref >60)
GLUCOSE: 141 mg/dL — AB (ref 70–140)
Potassium: 3.9 mEq/L (ref 3.5–5.1)
Sodium: 142 mEq/L (ref 136–145)
TOTAL PROTEIN: 7.6 g/dL (ref 6.4–8.3)

## 2017-01-22 LAB — FERRITIN: FERRITIN: 207 ng/mL (ref 9–269)

## 2017-01-22 MED ORDER — ANASTROZOLE 1 MG PO TABS: 1.0000 mg | ORAL_TABLET | Freq: Every day | ORAL | 0 refills | Status: DC

## 2017-01-22 NOTE — Telephone Encounter (Signed)
Scheduled appt per 11/29 los - Gave patient AVS and calender per los. Lab and f/u in 6 months.

## 2017-02-02 ENCOUNTER — Other Ambulatory Visit: Payer: Self-pay | Admitting: Physician Assistant

## 2017-02-02 DIAGNOSIS — E119 Type 2 diabetes mellitus without complications: Secondary | ICD-10-CM

## 2017-02-02 DIAGNOSIS — M810 Age-related osteoporosis without current pathological fracture: Secondary | ICD-10-CM

## 2017-04-29 ENCOUNTER — Other Ambulatory Visit: Payer: Self-pay | Admitting: Physician Assistant

## 2017-04-29 DIAGNOSIS — I10 Essential (primary) hypertension: Secondary | ICD-10-CM

## 2017-04-29 DIAGNOSIS — E119 Type 2 diabetes mellitus without complications: Secondary | ICD-10-CM

## 2017-06-09 ENCOUNTER — Other Ambulatory Visit: Payer: Self-pay | Admitting: Physician Assistant

## 2017-06-09 DIAGNOSIS — I10 Essential (primary) hypertension: Secondary | ICD-10-CM

## 2017-06-09 DIAGNOSIS — E119 Type 2 diabetes mellitus without complications: Secondary | ICD-10-CM

## 2017-06-17 ENCOUNTER — Other Ambulatory Visit: Payer: Self-pay | Admitting: Hematology

## 2017-06-17 DIAGNOSIS — Z853 Personal history of malignant neoplasm of breast: Secondary | ICD-10-CM

## 2017-07-04 ENCOUNTER — Other Ambulatory Visit: Payer: Self-pay | Admitting: Physician Assistant

## 2017-07-04 DIAGNOSIS — I10 Essential (primary) hypertension: Secondary | ICD-10-CM

## 2017-07-04 DIAGNOSIS — E119 Type 2 diabetes mellitus without complications: Secondary | ICD-10-CM

## 2017-07-06 ENCOUNTER — Other Ambulatory Visit: Payer: Self-pay | Admitting: Physician Assistant

## 2017-07-06 DIAGNOSIS — I10 Essential (primary) hypertension: Secondary | ICD-10-CM

## 2017-07-06 DIAGNOSIS — E119 Type 2 diabetes mellitus without complications: Secondary | ICD-10-CM

## 2017-07-14 ENCOUNTER — Other Ambulatory Visit: Payer: Self-pay | Admitting: Hematology

## 2017-07-14 DIAGNOSIS — Z853 Personal history of malignant neoplasm of breast: Secondary | ICD-10-CM

## 2017-07-21 ENCOUNTER — Telehealth: Payer: Self-pay | Admitting: *Deleted

## 2017-07-21 NOTE — Progress Notes (Signed)
[name]  [phone] [phone]  Clinic Follow Up Note   Patient Care Team: Mittie Bodo as PCP - General (Physician Assistant) Truitt Merle, MD as Consulting Physician (Hematology)   Date of Service:  07/22/2017   CHIEF COMPLAINTS:  Follow up right breast cancer  Oncology History   Breast cancer of upper-outer quadrant of right female breast [facility])   Staging form: Breast, AJCC 7th Edition   - Clinical stage from 04/22/2010: Stage IIB (T3, N0, M0) - Signed by Truitt Merle, MD on 01/21/2016   - Pathologic stage from 09/11/2010: Stage IA (yT1c, N0, cM0) - Signed by Truitt Merle, MD on 01/21/2016      Breast cancer of upper-outer quadrant of right female breast ([place])   04/22/2010 Initial Diagnosis    Breast cancer of upper-outer quadrant of right female breast ([place])      04/22/2010 Receptors her2     ER 50% positive, PR negative, HER-2 positive      04/22/2010 Initial Biopsy     Right  Breast mass pops A showed invasive ductal carcinoma, G3      04/25/2010 Imaging    Breast MRI showed a 7.7 cm abnormal linear enhancement and confluent masses in the upper outer quadrant of the right breast which correlate with the recently biopsied breast cancer.      05/09/2010 Imaging    PET scan was  Negative for metastatic adenopathy ordistant metastasis.      04/2010 -  Chemotherapy    She received 4 cycle of Abraxane and Herceptin every 3 weeks       09/11/2010 Surgery     Right breast mastectomy and sentinel lymph node biopsy      09/2010 -  Anti-estrogen oral therapy     Adjuvant anastrozole 1 mg daily        HISTORY OF PRESENTING ILLNESS (01/21/2016):  [name] 62 y.o. female is here because of ongoing breast cancer surveillance. 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 She underwent right breast mastectomy with Dr. Barry Dienes on 09/11/2010 which was negative for invasive malignancy  or lymph node involvement. This was found to be estrogen receptor / HER2 positive, progesterone receptor negative disease. She did not receive radiation therapy.  She saw Dr. Eston Esters for oncology care. From Dr. Julien Girt last note on 05/30/2011: "She has completed 4 cycles of neoadjuvant Abraxane/Herceptin given in combination 3 weeks on, 1 Herceptin alone on week 4.  Abraxane was then discontinued due to significant pain exacerbation in left shoulder, upper back, neck and chest region with neuropathy symptoms which have since abated while she is on gabapentin 300 mg p.o. T.i.d.".   She presents with her daughter and granddaughter. The patient does not speak English well so her daughter helped translate and facilitate our visit today. The patient moved to [place] in 2013 around July. She saw an oncologist in [place] but does not recall their name. She began Arimidex after completing chemotherapy and ran out about a month ago. She moved back to [facility] this June. She had annual breast exams while in [place]. She is not sure when her last bone density scan was. She was told to take Arimidex for 10 years.  When she first began taking medication she experienced full body pain and was switched to another medication in [place]. She continues to experience pain though it is not as much and is tolerable. She complains of pain/numbness around  her right chest wall post-mastectomy. She has chronic back pain.   She denies abdominal pain, cough, or shortness of breath. She is eating and sleeping well. She has not been diagnosed with any other major health issues or had any major surgeries recently.   Current Therapy: Anastrozole 1 mg daily starting 09/2010, plan to complete in July 2019   INTERIM HISTORY  [name] presents today for follow up of right breast cancer. She was last seen by me 6 months ago. She is accompanied by her daughter who helps translate in [place] for her. She notes  she is tolerating Anastrozole well.    On review of symptoms, pt notes joint pain and right breast pain are stable.        MEDICAL HISTORY:  Past Medical History:  Diagnosis Date  . Anxiety    ATIVAN HELPS PT TO SLEEP  . Breast cancer ([place])    breast right side -COMPLETED CHEMO WITH DR. Collier Salina RUBIN--EXCEPT ARIMIDEX  . Cough    PT HAS HAD COUGH FOR PAST MONTH-WHITE PHLEGM  . Hyperlipidemia   . Hypertension   . Indigestion    TAKING ZANTAC  . Personal history of chemotherapy 2012    SURGICAL HISTORY: Past Surgical History:  Procedure Laterality Date  . MASTECTOMY  09/12/10   right  . PORT-A-CATH REMOVAL  03/19/2011   Procedure: REMOVAL PORT-A-CATH;  Surgeon: Stark Klein, MD;  Location: [facility];  Service: General;  Laterality: N/A;  . PORTACATH PLACEMENT      SOCIAL HISTORY: Social History   Socioeconomic History  . Marital status: Single    Spouse name: Not on file  . Number of children: Not on file  . Years of education: Not on file  . Highest education level: Not on file  Occupational History  . Not on file  Social Needs  . Financial resource strain: Not on file  . Food insecurity:    Worry: Not on file    Inability: Not on file  . Transportation needs:    Medical: Not on file    Non-medical: Not on file  Tobacco Use  . Smoking status: Never Smoker  . Smokeless tobacco: Never Used  Substance and Sexual Activity  . Alcohol use: No  . Drug use: No  . Sexual activity: Not Currently  Lifestyle  . Physical activity:    Days per week: Not on file    Minutes per session: Not on file  . Stress: Not on file  Relationships  . Social connections:    Talks on phone: Not on file    Gets together: Not on file    Attends religious service: Not on file    Active member of club or organization: Not on file    Attends meetings of clubs or organizations: Not on file    Relationship status: Not on file  . Intimate partner violence:    Fear of current or ex partner:  Not on file    Emotionally abused: Not on file    Physically abused: Not on file    Forced sexual activity: Not on file  Other Topics Concern  . Not on file  Social History Narrative   Widowed for over 20 years. She has 4 children and 8 grandchildren. She lives with one of her daughters. She enjoys gardening and other yard work. She is not currently working.   She does not do routine exercise activity, but is always active.    FAMILY HISTORY: Family History  Problem Relation Age of Onset  . Hypertension Mother   . Diabetes Mother   . Cancer Father        unknown type cancer   . Diabetes Sister   . Hypertension Sister   . Cancer Sister   . Thyroid disease Sister   . Breast cancer Neg Hx     ALLERGIES:  is allergic to asa buff (mag [buffered aspirin].  MEDICATIONS:  Current Outpatient Medications  Medication Sig Dispense Refill  . alendronate (FOSAMAX) 35 MG tablet Take 1 tablet (35 mg total) by mouth every 7 (seven) days. Take with a full glass of water on an empty stomach. 12 tablet 4  . alendronate (FOSAMAX) 35 MG tablet TAKE 1 TABLET(35 MG) BY MOUTH EVERY 7 DAYS WITH A FULL GLASS OF WATER AND ON AN EMPTY STOMACH 12 tablet 0  . anastrozole (ARIMIDEX) 1 MG tablet TAKE 1 TABLET(1 MG) BY MOUTH DAILY 30 tablet 0  . Cholecalciferol (VITAMIN D3) 2000 units TABS Take 1 tablet by mouth daily. 90 tablet 2  . metFORMIN (GLUCOPHAGE) 500 MG tablet TAKE 1 TABLET(500 MG) BY MOUTH TWICE DAILY WITH A MEAL 30 tablet 0  . metoprolol succinate (TOPROL-XL) 50 MG 24 hr tablet TAKE 1 TABLET BY MOUTH DAILY WITH OR IMMEDIATELY FOLLOWING A MEAL 30 tablet 0  . Multiple Vitamin (MULITIVITAMIN WITH MINERALS) TABS Take 2 tablets by mouth daily. Reported on 08/30/2015    . simvastatin (ZOCOR) 20 MG tablet TAKE 1 TABLET BY MOUTH AT BEDTIME 30 tablet 0   No current facility-administered medications for this visit.     REVIEW OF SYSTEMS:   Constitutional: Denies fevers, chills or abnormal night  sweats Eyes: Denies blurriness of vision, double vision or watery eyes Ears, nose, mouth, throat, and face: Denies mucositis or sore throat Respiratory: Denies cough, dyspnea or wheezes Cardiovascular: Denies palpitation, chest discomfort or lower extremity swelling Gastrointestinal:  Denies nausea, heartburn or change in bowel habits Skin: Denies abnormal skin rashes Lymphatics: Denies new lymphadenopathy or easy bruising Musculoskeletal: (+) Joint pain, Right hip pain Neurological:Denies, tingling or new weaknesses BREAST: (+) Right chest wall tenderness Behavioral/Psych: Mood is stable, no new changes  All other systems were reviewed with the patient and are negative.  PHYSICAL EXAMINATION:  ECOG PERFORMANCE STATUS: 0 - Asymptomatic  Vitals:   07/22/17 0923  BP: 136/86  Pulse: 80  Resp: 17  Temp: 98.1 F (36.7 C)   Filed Weights   07/22/17 0923  Weight: 130 lb 3.2 oz (59.1 kg)    GENERAL:alert, no distress and comfortable SKIN: skin color, texture, turgor are normal, no rashes or significant lesions EYES: normal, conjunctiva are pink and non-injected, sclera clear OROPHARYNX:no exudate, no erythema and lips, buccal mucosa, and tongue normal  NECK: supple, thyroid normal size, non-tender, without nodularity LYMPH:  no palpable lymphadenopathy in the cervical, axillary or inguinal LUNGS: clear to auscultation and percussion with normal breathing effort HEART: regular rate & rhythm and no murmurs and no lower extremity edema ABDOMEN:abdomen soft, non-tender and normal bowel sounds Musculoskeletal:no cyanosis of digits and no clubbing  PSYCH: alert & oriented x 3 with fluent speech NEURO: no focal motor/sensory deficits BREAST: Left breast and axillary region is without palpable abnormality or nodules. S/p right breast mastectomy with well healed surgical scar and no palpable nodularity, Tender   LABORATORY DATA:  I have reviewed the data as listed CBC Latest Ref Rng &  Units 07/22/2017 01/22/2017 07/22/2016  WBC 3.9 - 10.3 K/uL 7.0 7.3 8.1  Hemoglobin 11.6 - 15.9 g/dL 11.2(L) 10.9(L) 11.2(L)  Hematocrit 34.8 - 46.6 % 36.5 35.0 37.1  Platelets 145 - 400 K/uL 272 291 260    PATHOLOGY    RADIOGRAPHIC STUDIES: I have personally reviewed the radiological images as listed and agreed with the findings in the report.   Screening Mammogram bilateral 12/02/16 IMPRESSION: No mammographic evidence of malignancy. A result letter of this screening mammogram will be mailed directly to the patient. RECOMMENDATION: Screening mammogram in one year.   Bone Density Scan 09/02/16 ASSESSMENT: The BMD measured at Femur Neck Right is 0.657 g/cm2 with a T-score of -2.7. This patient is considered osteoporotic according to [facility]) criteria. There has been a statistically significant decrease in BMD of Left hip, and a statistically significant increase in BMD of Lumbar Spine since prior exam dated 03/24/2011. Site Region Measured Date Measured Age YA BMD Significant CHANGE T-score DualFemur Neck Right 09/02/2016    61.2         -2.7    0.657 g/cm2 AP Spine L1-L4 09/02/2016 61.2 -0.9 1.080 g/cm2 *   MR Breast bilateral w wo contrast 05/02/2010 IMPRESSION: 7.7 cm abnormal linear enhancement and confluent masses in the upper outer quadrant of the right breast which correlate with the recently biopsied breast cancer.  Diagnostic mammogram 11/29/2015 IMPRESSION: There is no persistent abnormality in the area of concern in the left breast, most likely representing normal fibroglandular tissue. RECOMMENDATION: Screening mammogram in one year.(Code:SM-B-01Y)  Screening mammogram 11/22/2015 IMPRESSION: Further evaluation is suggested for possible mass in the left breast. RECOMMENDATION: Diagnostic mammogram and possibly ultrasound of the left breast. (Code:[id number]-62M)  ASSESSMENT & PLAN:   1.  Breast cancer of upper-outer quadrant of right  breast, cT3N0M0 stage IIB, ER+/PR-/HER2+ -I previously, extensively reviewed the patient's chart and history - I previously  discussed the risk of cancer recurrence. She received neoadjuvant chemotherapy with Abraxane and Herceptin,  But did not complete 1 year Herceptin due to side effects. She is at moderate to high risk for recurrence. -I previously discussed  Adjuvant endocrine therapy with the patient. We discussed Arimidex use for 5 years versus 10 years,  And the data shows decreased recurrence with extended aromatase inhibitor. Given her moderate to high risk of recurrence, I recommend her to continue anastrozole for 7 years. She has been tolerating well overall, agrees to continue. -She has mild arthralgia, possible related to anastrozole, manageable. No other significant side effects. -She will continue to follow with [place] for breast cancer surveillance. She will continue annual screening mammograms. - I encouraged her to have healthy diet and exercise regularly. -She previously notes to having had a colonoscopy over 10 years ago. I offered to refer her to [place] for a screening colonoscopy. She is reluctant to do it, will think about doing the procedure. Her PCP has previously ordered stool card for her.   -She is clinically doing well. Lab reviewed, her CBC and CMP are within normal limits except her Glucose is at 177 and has mild anemia with Hg at 11.2. Her physical exam was unremarkable. There is no clinical concern for recurrence. -I discussed she should be fine to finish Anastrozole at the end of July, for a total of 7 years. She will still need a  Left breast Screening mammogram once a year. She is due in 11/2017.  -She will also need to continue to follow up with me as she is still at risk for late recurrence.  -I encouraged her to  watch for cornering symptoms of significant breast pain or change, unexpected bone, hip or back pain.  -F/u in 1 year   2. Microcytic Anemia  -chronic  since 2017, overall mild  -iron study and ferritin on 01/22/2017 is normal -TSH normal in 2017  -I checked hemoglobin electrophoresis to rule out beta thalassemia -Hg at 11.2 today (07/22/17). Continue follow-up  3. Osteoporosis  -Patient is unsure when her last bone density scan was performed -I previously spoke with the patient about the risk of decreased bone strength while taking anastrozole -She is taking calcium and Vitamin D. I previously encouraged the patient to increase physical activity to help with bone strength - Patient has tolerable joint pain in her fingers and hip pain. I encouraged her to take Tylenol over the counter joint medication to help. She does not want to take it. -08/2016 Bone Scan showed Osteoporosis in the right femur neck with a T-Score of -2.7. -I encouraged her to continue Vitamin D and start calcium 500-6106m 1-2 times a day. I suggest chewable calcium and to increase calcium in her diet.   -I advised her to watch for falls due to her right hip being at high risk for fracture.  -She has been taking Fosamax since 2012 and has had partial response. She will continue Fosamax    4. HTN and DM - she will continue follow-up with her primary care physician  And continue medication  5. Cancer screening  -She had colonoscopy more than 10 years ago, I recommend her to have a repeat one.  She is very reluctant to have it, she will do stool card fist  Plan: -Lab and f/u in one year  -Screening Mammogram in 11/2017 -Continue Anastrozole until end of 08/2017, I refilled for her today  -Continue Fosamax    Orders Placed This Encounter  Procedures  . MM Digital Screening Unilat L    Standing Status:   Future    Standing Expiration Date:   07/22/2018    Order Specific Question:   Reason for Exam (SYMPTOM  OR DIAGNOSIS REQUIRED)    Answer:   screening    Order Specific Question:   [facility] imaging [facility]?    [facility]   All questions were  answered. The patient knows to call the clinic with any problems, questions or concerns. I spent 20 minutes counseling the patient face to face. The total time spent in the appointment was 25 minutes and more than 50% was on counseling.  IOneal Deputy am acting as scribe for YTruitt Merle MD.     I have reviewed the above documentation for accuracy and completeness, and I agree with the above.      YTruitt Merle MD 07/22/2017

## 2017-07-22 ENCOUNTER — Telehealth: Payer: Self-pay

## 2017-07-22 ENCOUNTER — Inpatient Hospital Stay: Payer: Medicare Other | Attending: Hematology | Admitting: Hematology

## 2017-07-22 ENCOUNTER — Inpatient Hospital Stay: Payer: Medicare Other

## 2017-07-22 ENCOUNTER — Encounter: Payer: Self-pay | Admitting: Hematology

## 2017-07-22 VITALS — BP 136/86 | HR 80 | Temp 98.1°F | Resp 17 | Ht <= 58 in | Wt 130.2 lb

## 2017-07-22 DIAGNOSIS — G8929 Other chronic pain: Secondary | ICD-10-CM | POA: Insufficient documentation

## 2017-07-22 DIAGNOSIS — I10 Essential (primary) hypertension: Secondary | ICD-10-CM | POA: Diagnosis not present

## 2017-07-22 DIAGNOSIS — Z170 Estrogen receptor positive status [ER+]: Principal | ICD-10-CM

## 2017-07-22 DIAGNOSIS — D509 Iron deficiency anemia, unspecified: Secondary | ICD-10-CM

## 2017-07-22 DIAGNOSIS — D649 Anemia, unspecified: Secondary | ICD-10-CM

## 2017-07-22 DIAGNOSIS — Z1231 Encounter for screening mammogram for malignant neoplasm of breast: Secondary | ICD-10-CM

## 2017-07-22 DIAGNOSIS — N644 Mastodynia: Secondary | ICD-10-CM | POA: Diagnosis not present

## 2017-07-22 DIAGNOSIS — M810 Age-related osteoporosis without current pathological fracture: Secondary | ICD-10-CM | POA: Diagnosis not present

## 2017-07-22 DIAGNOSIS — Z853 Personal history of malignant neoplasm of breast: Secondary | ICD-10-CM

## 2017-07-22 DIAGNOSIS — Z803 Family history of malignant neoplasm of breast: Secondary | ICD-10-CM | POA: Diagnosis not present

## 2017-07-22 DIAGNOSIS — Z8249 Family history of ischemic heart disease and other diseases of the circulatory system: Secondary | ICD-10-CM

## 2017-07-22 DIAGNOSIS — Z79811 Long term (current) use of aromatase inhibitors: Secondary | ICD-10-CM | POA: Insufficient documentation

## 2017-07-22 DIAGNOSIS — C50411 Malignant neoplasm of upper-outer quadrant of right female breast: Secondary | ICD-10-CM | POA: Diagnosis not present

## 2017-07-22 DIAGNOSIS — Z79899 Other long term (current) drug therapy: Secondary | ICD-10-CM

## 2017-07-22 DIAGNOSIS — Z7984 Long term (current) use of oral hypoglycemic drugs: Secondary | ICD-10-CM | POA: Diagnosis not present

## 2017-07-22 DIAGNOSIS — E119 Type 2 diabetes mellitus without complications: Secondary | ICD-10-CM | POA: Diagnosis not present

## 2017-07-22 LAB — COMPREHENSIVE METABOLIC PANEL
ALT: 30 U/L (ref 0–55)
ANION GAP: 11 (ref 3–11)
AST: 19 U/L (ref 5–34)
Albumin: 4.1 g/dL (ref 3.5–5.0)
Alkaline Phosphatase: 58 U/L (ref 40–150)
BUN: 13 mg/dL (ref 7–26)
CALCIUM: 9.2 mg/dL (ref 8.4–10.4)
CHLORIDE: 105 mmol/L (ref 98–109)
CO2: 24 mmol/L (ref 22–29)
Creatinine, Ser: 0.89 mg/dL (ref 0.60–1.10)
GFR calc Af Amer: >60 mL/min (ref >60)
GFR calc non Af Amer: >60 mL/min (ref >60)
Glucose, Bld: 177 mg/dL — ABNORMAL HIGH (ref 70–140)
Potassium: 4.3 mmol/L (ref 3.5–5.1)
SODIUM: 140 mmol/L (ref 136–145)
Total Bilirubin: 0.6 mg/dL (ref 0.2–1.2)
Total Protein: 7.4 g/dL (ref 6.4–8.3)

## 2017-07-22 LAB — CBC WITH DIFFERENTIAL/PLATELET
Basophils Absolute: 0 10*3/uL (ref 0.0–0.1)
Basophils Relative: 0 %
EOS ABS: 0.8 10*3/uL — AB (ref 0.0–0.5)
EOS PCT: 12 %
HCT: 36.5 % (ref 34.8–46.6)
Hemoglobin: 11.2 g/dL — ABNORMAL LOW (ref 11.6–15.9)
LYMPHS ABS: 2.9 10*3/uL (ref 0.9–3.3)
Lymphocytes Relative: 40 %
MCH: 23 pg — AB (ref 25.1–34.0)
MCHC: 30.7 g/dL — AB (ref 31.5–36.0)
MCV: 74.9 fL — ABNORMAL LOW (ref 79.5–101.0)
MONO ABS: 0.5 10*3/uL (ref 0.1–0.9)
MONOS PCT: 7 %
Neutro Abs: 2.9 10*3/uL (ref 1.5–6.5)
Neutrophils Relative %: 41 %
PLATELETS: 272 10*3/uL (ref 145–400)
RBC: 4.87 MIL/uL (ref 3.70–5.45)
RDW: 13.9 % (ref 11.2–14.5)
WBC: 7 10*3/uL (ref 3.9–10.3)

## 2017-07-22 MED ORDER — ANASTROZOLE 1 MG PO TABS: ORAL_TABLET | ORAL | 0 refills | Status: DC

## 2017-07-22 NOTE — Telephone Encounter (Signed)
Printed avs and calender of upcoming appointment. Per 5/29 los 

## 2017-07-27 LAB — HEMOGLOBINOPATHY EVALUATION
HGB F QUANT: 0.6 % (ref 0.0–2.0)
HGB S QUANTITAION: 0 %
HGB VARIANT: 16.4 % — AB
Hgb A2 Quant: 4 % — ABNORMAL HIGH (ref 1.8–3.2)
Hgb A: 79 % — ABNORMAL LOW (ref 96.4–98.8)
Hgb C: 0 %

## 2017-08-01 ENCOUNTER — Other Ambulatory Visit: Payer: Self-pay | Admitting: Physician Assistant

## 2017-08-01 DIAGNOSIS — I10 Essential (primary) hypertension: Secondary | ICD-10-CM

## 2017-08-01 DIAGNOSIS — E119 Type 2 diabetes mellitus without complications: Secondary | ICD-10-CM

## 2017-08-03 NOTE — Telephone Encounter (Signed)
Patient called, left VM to return call to the office to schedule an appointment before medications will be refilled.

## 2017-08-04 ENCOUNTER — Other Ambulatory Visit: Payer: Self-pay | Admitting: Physician Assistant

## 2017-08-04 DIAGNOSIS — E119 Type 2 diabetes mellitus without complications: Secondary | ICD-10-CM

## 2017-08-18 ENCOUNTER — Encounter: Payer: Self-pay | Admitting: Physician Assistant

## 2017-08-18 ENCOUNTER — Ambulatory Visit (INDEPENDENT_AMBULATORY_CARE_PROVIDER_SITE_OTHER): Payer: Medicare Other | Admitting: Physician Assistant

## 2017-08-18 ENCOUNTER — Other Ambulatory Visit: Payer: Self-pay

## 2017-08-18 VITALS — BP 120/70 | HR 90 | Temp 98.4°F | Resp 18 | Ht <= 58 in | Wt 130.4 lb

## 2017-08-18 DIAGNOSIS — M810 Age-related osteoporosis without current pathological fracture: Secondary | ICD-10-CM

## 2017-08-18 DIAGNOSIS — I10 Essential (primary) hypertension: Secondary | ICD-10-CM | POA: Diagnosis not present

## 2017-08-18 DIAGNOSIS — E119 Type 2 diabetes mellitus without complications: Secondary | ICD-10-CM

## 2017-08-18 DIAGNOSIS — Z1211 Encounter for screening for malignant neoplasm of colon: Secondary | ICD-10-CM

## 2017-08-18 DIAGNOSIS — E782 Mixed hyperlipidemia: Secondary | ICD-10-CM | POA: Diagnosis not present

## 2017-08-18 MED ORDER — ALENDRONATE SODIUM 35 MG PO TABS: 35.0000 mg | ORAL_TABLET | ORAL | 4 refills | Status: DC

## 2017-08-18 MED ORDER — SIMVASTATIN 20 MG PO TABS: 20.0000 mg | ORAL_TABLET | Freq: Every day | ORAL | 4 refills | Status: DC

## 2017-08-18 MED ORDER — METFORMIN HCL 500 MG PO TABS: ORAL_TABLET | ORAL | 1 refills | Status: DC

## 2017-08-18 MED ORDER — METOPROLOL SUCCINATE ER 50 MG PO TB24: ORAL_TABLET | ORAL | 1 refills | Status: DC

## 2017-08-18 NOTE — Progress Notes (Signed)
[name]  MRN: [id number] DOB: 1955/04/19  PCP: Mancel Bale, PA-C  Chief Complaint  Patient presents with  . Medication Refill    Subjective:  Pt presents to clinic for medication refill.  She is doing well on her medications.    History is obtained by patient.  Review of Systems  Constitutional: Negative for chills and fever.  Eyes: Negative for visual disturbance.  Respiratory: Negative for shortness of breath.   Cardiovascular: Negative for chest pain, palpitations and leg swelling.  Skin: Negative for wound.  Neurological: Negative for dizziness, light-headedness, numbness and headaches.    Patient Active Problem List   Diagnosis Date Noted  . Chest pain with moderate risk for cardiac etiology 11/19/2016  . Systolic murmur 51/03/5850  . Rapid palpitations 11/19/2016  . Essential hypertension 08/30/2015  . Elevated cholesterol 08/30/2015  . Osteoporosis 08/30/2015  . Heartburn 08/30/2015  . Diabetes type 2, controlled ([place]) 08/30/2015  . Breast cancer of upper-outer quadrant of right female breast ([place]) 08/27/2010    Current Outpatient Medications on File Prior to Visit  Medication Sig Dispense Refill  . anastrozole (ARIMIDEX) 1 MG tablet TAKE 1 TABLET(1 MG) BY MOUTH DAILY 30 tablet 0  . Cholecalciferol (VITAMIN D3) 2000 units TABS Take 1 tablet by mouth daily. 90 tablet 2  . Multiple Vitamin (MULITIVITAMIN WITH MINERALS) TABS Take 2 tablets by mouth daily. Reported on 08/30/2015     No current facility-administered medications on file prior to visit.     Allergies  Allergen Reactions  . Asa Buff (Mag [Buffered Aspirin] Nausea And Vomiting    Past Medical History:  Diagnosis Date  . Anxiety    ATIVAN HELPS PT TO SLEEP  . Breast cancer ([place])    breast right side -COMPLETED CHEMO WITH DR. Collier Salina RUBIN--EXCEPT ARIMIDEX  . Cough    PT HAS HAD COUGH FOR PAST MONTH-WHITE PHLEGM  . Hyperlipidemia   . Hypertension   . Indigestion    TAKING ZANTAC  .  Personal history of chemotherapy 2012   Social History   Social History Narrative   Widowed for over 20 years. She has 4 children and 8 grandchildren. She lives with one of her daughters. She enjoys gardening and other yard work. She is not currently working.   She does not do routine exercise activity, but is always active.   Social History   Tobacco Use  . Smoking status: Never Smoker  . Smokeless tobacco: Never Used  Substance Use Topics  . Alcohol use: No  . Drug use: No   family history includes Cancer in her father and sister; Diabetes in her mother and sister; Hypertension in her mother and sister; Thyroid disease in her sister.     Objective:  BP 120/70   Pulse 90   Temp 98.4 F (36.9 C) (Oral)   Resp 18   Ht '4\' 8"'  (1.422 m)   Wt 130 lb 6.4 oz (59.1 kg)   SpO2 97%   BMI 29.24 kg/m  Body mass index is 29.24 kg/m.  Wt Readings from Last 3 Encounters:  08/18/17 130 lb 6.4 oz (59.1 kg)  07/22/17 130 lb 3.2 oz (59.1 kg)  01/22/17 130 lb 9.6 oz (59.2 kg)    Physical Exam  Constitutional: She is oriented to person, place, and time. She appears well-developed and well-nourished.  HENT:  Head: Normocephalic and atraumatic.  Right Ear: Hearing and external ear normal.  Left Ear: Hearing and external ear normal.  Eyes: Conjunctivae are normal.  Neck: Normal range of motion.  Cardiovascular: Normal rate, regular rhythm and normal heart sounds.  No murmur heard. Pulmonary/Chest: Effort normal and breath sounds normal.  Musculoskeletal:       Right lower leg: She exhibits no edema.       Left lower leg: She exhibits no edema.  Neurological: She is alert and oriented to person, place, and time.  Skin: Skin is warm and dry.  Psychiatric: She has a normal mood and affect. Her behavior is normal. Judgment and thought content normal.  Vitals reviewed.   Assessment and Plan :  Essential hypertension - Plan: CMP14+EGFR, metoprolol succinate (TOPROL-XL) 50 MG 24 hr  tablet - well controlled  Controlled type 2 diabetes mellitus without complication, without long-term current use of insulin (HCC) - Plan: CMP14+EGFR, Hemoglobin A1c,  metFORMIN (GLUCOPHAGE) 500 MG tablet, Microalbumin, urine, HM DIABETES FOOT EXAM - d/w pt not being on ACE/ARB for kidney protection but at this time she declines -   Elevated triglycerides with high cholesterol - on medications - Zocor qd  Age-related osteoporosis without current pathological fracture - Plan: alendronate (FOSAMAX) 35 MG tablet - continue bone density in 2018  Screen for colon cancer - Plan: Cologuard - never received  Patient verbalized to me that they understand the following: diagnosis, what is being done for them, what to expect and what should be done at home.  Their questions have been answered.  See after visit summary for patient specific instructions.  Windell Hummingbird PA-C  [facility] 08/18/2017 5:00 PM  Please note: Portions of this report may have been transcribed using dragon voice recognition software. Every effort was made to ensure accuracy; however, inadvertent computerized transcription errors may be present.

## 2017-08-18 NOTE — Patient Instructions (Addendum)
  Keep doing your medications has you have been doing   IF you received an x-ray today, you will receive an invoice from [facility]. Please contact [facility] at [phone] with questions or concerns regarding your invoice.   IF you received labwork today, you will receive an invoice from [facility]. Please contact [facility] at [phone] with questions or concerns regarding your invoice.   Our billing staff will not be able to assist you with questions regarding bills from these companies.  You will be contacted with the lab results as soon as they are available. The fastest way to get your results is to activate your My Chart account. Instructions are located on the last page of this paperwork. If you have not heard from [place] regarding the results in 2 weeks, please contact this office.

## 2017-08-19 LAB — CMP14+EGFR
ALBUMIN: 4.5 g/dL (ref 3.6–4.8)
ALK PHOS: 58 IU/L (ref 39–117)
ALT: 24 IU/L (ref 0–32)
AST: 14 IU/L (ref 0–40)
Albumin/Globulin Ratio: 1.9 (ref 1.2–2.2)
BUN / CREAT RATIO: 23 (ref 12–28)
BUN: 19 mg/dL (ref 8–27)
Bilirubin Total: 0.3 mg/dL (ref 0.0–1.2)
CO2: 23 mmol/L (ref 20–29)
CREATININE: 0.84 mg/dL (ref 0.57–1.00)
Calcium: 9.4 mg/dL (ref 8.7–10.3)
Chloride: 105 mmol/L (ref 96–106)
GFR calc Af Amer: 86 mL/min/{1.73_m2} (ref >59)
GFR calc non Af Amer: 75 mL/min/{1.73_m2} (ref >59)
Globulin, Total: 2.4 g/dL (ref 1.5–4.5)
Glucose: 229 mg/dL — ABNORMAL HIGH (ref 65–99)
Potassium: 4.6 mmol/L (ref 3.5–5.2)
Sodium: 142 mmol/L (ref 134–144)
Total Protein: 6.9 g/dL (ref 6.0–8.5)

## 2017-08-19 LAB — HEMOGLOBIN A1C
ESTIMATED AVERAGE GLUCOSE: 151 mg/dL
HEMOGLOBIN A1C: 6.9 % — AB (ref 4.8–5.6)

## 2017-08-19 LAB — MICROALBUMIN, URINE: Microalbumin, Urine: 21.1 ug/mL

## 2017-08-24 DIAGNOSIS — Z1211 Encounter for screening for malignant neoplasm of colon: Secondary | ICD-10-CM | POA: Diagnosis not present

## 2017-09-11 LAB — COLOGUARD: COLOGUARD: NEGATIVE

## 2017-12-04 ENCOUNTER — Ambulatory Visit
Admission: RE | Admit: 2017-12-04 | Discharge: 2017-12-04 | Disposition: A | Discharge status: Home / Self Care | Payer: Medicare Other | Source: Ambulatory Visit | Attending: Hematology | Admitting: Hematology

## 2017-12-04 DIAGNOSIS — Z1231 Encounter for screening mammogram for malignant neoplasm of breast: Secondary | ICD-10-CM

## 2018-02-08 ENCOUNTER — Other Ambulatory Visit: Payer: Self-pay | Admitting: Family Medicine

## 2018-02-08 ENCOUNTER — Other Ambulatory Visit: Payer: Self-pay

## 2018-02-08 DIAGNOSIS — E119 Type 2 diabetes mellitus without complications: Secondary | ICD-10-CM

## 2018-02-08 MED ORDER — METFORMIN HCL 500 MG PO TABS: ORAL_TABLET | ORAL | 0 refills | Status: DC

## 2018-02-19 ENCOUNTER — Encounter: Payer: Medicare Other | Admitting: Physician Assistant

## 2018-03-03 ENCOUNTER — Ambulatory Visit (INDEPENDENT_AMBULATORY_CARE_PROVIDER_SITE_OTHER): Payer: Medicare Other | Admitting: Family Medicine

## 2018-03-03 ENCOUNTER — Other Ambulatory Visit: Payer: Self-pay

## 2018-03-03 ENCOUNTER — Encounter: Payer: Self-pay | Admitting: Family Medicine

## 2018-03-03 VITALS — BP 135/78 | HR 74 | Temp 97.8°F | Resp 12 | Ht <= 58 in | Wt 129.8 lb

## 2018-03-03 DIAGNOSIS — M810 Age-related osteoporosis without current pathological fracture: Secondary | ICD-10-CM | POA: Diagnosis not present

## 2018-03-03 DIAGNOSIS — Z23 Encounter for immunization: Secondary | ICD-10-CM | POA: Diagnosis not present

## 2018-03-03 DIAGNOSIS — E119 Type 2 diabetes mellitus without complications: Secondary | ICD-10-CM | POA: Diagnosis not present

## 2018-03-03 DIAGNOSIS — I10 Essential (primary) hypertension: Secondary | ICD-10-CM | POA: Diagnosis not present

## 2018-03-03 DIAGNOSIS — Z0000 Encounter for general adult medical examination without abnormal findings: Secondary | ICD-10-CM

## 2018-03-03 DIAGNOSIS — Z0001 Encounter for general adult medical examination with abnormal findings: Secondary | ICD-10-CM

## 2018-03-03 MED ORDER — VITAMIN D3 50 MCG (2000 UT) PO TABS: 1.0000 | ORAL_TABLET | Freq: Every day | ORAL | 2 refills | Status: DC

## 2018-03-03 MED ORDER — METFORMIN HCL 500 MG PO TABS: ORAL_TABLET | ORAL | 1 refills | Status: DC

## 2018-03-03 MED ORDER — METOPROLOL SUCCINATE ER 50 MG PO TB24: ORAL_TABLET | ORAL | 1 refills | Status: DC

## 2018-03-03 NOTE — Progress Notes (Signed)
QUICK REFERENCE INFORMATION: The ABCs of Providing the Annual Wellness Visit  CMS.gov Medicare Learning Network  [employer] Annual Wellness Visit  Subjective:   [name] is a 63 y.o. Female who presents for an Annual Wellness Visit.  Patient Active Problem List   Diagnosis Date Noted  . Chest pain with moderate risk for cardiac etiology 11/19/2016  . Systolic murmur 99/83/3825  . Rapid palpitations 11/19/2016  . Essential hypertension 08/30/2015  . Elevated cholesterol 08/30/2015  . Osteoporosis 08/30/2015  . Heartburn 08/30/2015  . Diabetes type 2, controlled ([place]) 08/30/2015  . Breast cancer of upper-outer quadrant of right female breast ([place]) 08/27/2010    Past Medical History:  Diagnosis Date  . Anxiety    ATIVAN HELPS PT TO SLEEP  . Breast cancer ([place])    breast right side -COMPLETED CHEMO WITH DR. Collier Salina RUBIN--EXCEPT ARIMIDEX  . Cough    PT HAS HAD COUGH FOR PAST MONTH-WHITE PHLEGM  . Hyperlipidemia   . Hypertension   . Indigestion    TAKING ZANTAC  . Personal history of chemotherapy 2012     Past Surgical History:  Procedure Laterality Date  . MASTECTOMY  09/12/10   right  . PORT-A-CATH REMOVAL  03/19/2011   Procedure: REMOVAL PORT-A-CATH;  Surgeon: Stark Klein, MD;  Location: [facility];  Service: General;  Laterality: N/A;  . PORTACATH PLACEMENT       Outpatient Medications Prior to Visit  Medication Sig Dispense Refill  . alendronate (FOSAMAX) 35 MG tablet Take 1 tablet (35 mg total) by mouth every 7 (seven) days. Take with a full glass of water on an empty stomach. 12 tablet 4  . Multiple Vitamin (MULITIVITAMIN WITH MINERALS) TABS Take 2 tablets by mouth daily. Reported on 08/30/2015    . simvastatin (ZOCOR) 20 MG tablet Take 1 tablet (20 mg total) by mouth at bedtime. 90 tablet 4  . Cholecalciferol (VITAMIN D3) 2000 units TABS Take 1 tablet by mouth daily. 90 tablet 2  . metFORMIN (GLUCOPHAGE) 500 MG tablet TAKE 1 TABLET(500 MG) BY MOUTH TWICE DAILY  WITH A MEAL 60 tablet 0  . metoprolol succinate (TOPROL-XL) 50 MG 24 hr tablet TAKE 1 TABLET BY MOUTH EVERY DAY WITH OR IMMEDIATELY FOLLOWING A MEAL 180 tablet 1  . anastrozole (ARIMIDEX) 1 MG tablet TAKE 1 TABLET(1 MG) BY MOUTH DAILY 30 tablet 0   No facility-administered medications prior to visit.     Allergies  Allergen Reactions  . Asa Buff (Mag [Buffered Aspirin] Nausea And Vomiting     Family History  Problem Relation Age of Onset  . Hypertension Mother   . Diabetes Mother   . Cancer Father        unknown type cancer   . Diabetes Sister   . Hypertension Sister   . Cancer Sister   . Thyroid disease Sister   . Breast cancer Neg Hx      Social History   Socioeconomic History  . Marital status: Single    Spouse name: Not on file  . Number of children: Not on file  . Years of education: Not on file  . Highest education level: Not on file  Occupational History  . Not on file  Social Needs  . Financial resource strain: Not on file  . Food insecurity:    Worry: Not on file    Inability: Not on file  . Transportation needs:    Medical: Not on file    Non-medical: Not on file  Tobacco Use  .  Smoking status: Never Smoker  . Smokeless tobacco: Never Used  Substance and Sexual Activity  . Alcohol use: No  . Drug use: No  . Sexual activity: Not Currently  Lifestyle  . Physical activity:    Days per week: Not on file    Minutes per session: Not on file  . Stress: Not on file  Relationships  . Social connections:    Talks on phone: Not on file    Gets together: Not on file    Attends religious service: Not on file    Active member of club or organization: Not on file    Attends meetings of clubs or organizations: Not on file    Relationship status: Not on file  Other Topics Concern  . Not on file  Social History Narrative   Widowed for over 20 years. She has 4 children and 8 grandchildren. She lives with one of her daughters. She enjoys gardening and other  yard work. She is not currently working.   She does not do routine exercise activity, but is always active.      Recent Hospitalizations? No  Health Habits: Current exercise activities include: none Exercise: 0 times/week. Diet: in general, a "healthy" diet    Alcohol intake: none  Health Risk Assessment: The patient has completed a Health Risk Assessment. This has been reveiwed with them and has been scanned into the [facility] system as an attached document.  Current Medical Providers and Suppliers: [facility] Patient Care Team: Forrest Moron, MD as PCP - General (Internal Medicine) Truitt Merle, MD as Consulting Physician (Hematology) Future Appointments  Date Time Provider [place]  07/21/2018  9:00 AM [facility] None  07/21/2018  9:30 AM Truitt Merle, MD [facility] None    Age-appropriate Screening Schedule: The list below includes current immunization status and future screening recommendations based on patient's age. Orders for these recommended tests are listed in the plan section. The patient has been provided with a written plan. Immunization History  Administered Date(s) Administered  . Influenza,inj,Quad PF,6+ Mos 12/01/2015, 11/18/2016  . Pneumococcal Polysaccharide-23 08/16/2016    Health Maintenance reviewed -  patient asked to schedule her mammogram   Depression Screen-PHQ2/9 completed today  Depression screen [facility] 2/9 03/03/2018 08/18/2017 11/18/2016 08/16/2016 12/01/2015  Decreased Interest 0 0 0 0 0  Down, Depressed, Hopeless 0 0 0 0 0  PHQ - 2 Score 0 0 0 0 0       Depression Severity and Treatment Recommendations:  0-4= None  5-9= Mild / Treatment: Support, educate to call if worse; return in one month  10-14= Moderate / Treatment: Support, watchful waiting; Antidepressant or Psycotherapy  15-19= Moderately severe / Treatment: Antidepressant OR Psychotherapy  >= 20 = Major depression, severe / Antidepressant AND  Psychotherapy  Functional Status Survey:   Is the patient deaf or have difficulty hearing?: No Does the patient have difficulty seeing, even when wearing glasses/contacts?: No Does the patient have difficulty concentrating, remembering, or making decisions?: No Does the patient have difficulty walking or climbing stairs?: No Does the patient have difficulty dressing or bathing?: No Does the patient have difficulty doing errands alone such as visiting a doctor's office or shopping?: No  Hearing Evaluation: 1. Do you have trouble hearing the television when others do not?   No 2. Do you have to strain to hear/understand conversations? No   Advanced Care Planning: 1. Patient has executed an Advance Directive: No   Cognitive Assessment: Does the patient have  evidence of cognitive impairment? No The patient does not have any evidence of any cognitive problems and denies any  change in mood/affect, appearance, speech, memory or motor skills.  Identification of Risk Factors: Risk factors include: diabetes mellitus and hypertension  ROS Review of Systems  Constitutional: Negative for activity change, appetite change, chills and fever.  HENT: Negative for congestion, nosebleeds, trouble swallowing and voice change.   Respiratory: Negative for cough, shortness of breath and wheezing.   Gastrointestinal: Negative for diarrhea, nausea and vomiting.  Genitourinary: Negative for difficulty urinating, dysuria, flank pain and hematuria.  Musculoskeletal: Negative for back pain, joint swelling and neck pain.  Neurological: Negative for dizziness, speech difficulty, light-headedness and numbness.  See HPI. All other review of systems negative.   Objective:   Vitals:   03/03/18 0806  BP: 135/78  Pulse: 74  Resp: 12  Temp: 97.8 F (36.6 C)  TempSrc: Oral  SpO2: 98%  Weight: 129 lb 12.8 oz (58.9 kg)  Height: 4\' 8"  (1.422 m)    Body mass index is 29.1 kg/m.  BP 135/78 (BP Location:  Right Arm, Patient Position: Sitting, Cuff Size: Normal)   Pulse 74   Temp 97.8 F (36.6 C) (Oral)   Resp 12   Ht 4\' 8"  (1.422 m)   Wt 129 lb 12.8 oz (58.9 kg)   SpO2 98%   BMI 29.10 kg/m   General Appearance:    Alert, cooperative, no distress, appears stated age  Head:    Normocephalic, without obvious abnormality, atraumatic  Eyes:    PERRL, conjunctiva/corneas clear, EOM's intact, fundi    benign, both eyes  Ears:    Normal TM's and external ear canals, both ears  Nose:   Nares normal, septum midline, mucosa normal, no drainage    or sinus tenderness  Throat:   Lips, mucosa, and tongue normal; teeth and gums normal  Neck:   Supple, symmetrical, trachea midline, no adenopathy;    thyroid:  no enlargement/tenderness/nodules;   Back:     Symmetric, no curvature, ROM normal, no CVA tenderness  Lungs:     Clear to auscultation bilaterally, respirations unlabored  Chest Wall:    No tenderness or deformity   Heart:    Regular rate and rhythm, S1 and S2 normal, no murmur, rub   or gallop  Abdomen:     Soft, non-tender, bowel sounds active all four quadrants,    no masses, no organomegaly  Extremities:   Extremities normal, atraumatic, no cyanosis or edema  Pulses:   2+ and symmetric all extremities  Skin:   Skin color, texture, turgor normal, no rashes or lesions  Lymph nodes:   Cervical, supraclavicular, and axillary nodes normal  Neurologic:   CNII-XII intact, normal strength, sensation and reflexes    throughout      Assessment/Plan:   Patient Self-Management and Personalized Health Advice The patient has been provided with information about:  reduce salt in diet and cooking  During the course of the visit the patient was educated and counseled about appropriate screening and preventive services including:   return annually or prn     Body mass index is 29.1 kg/m. Discussed the patient's BMI with her. The BMI BMI is not in the acceptable range; BMI management plan is  completed  [place] was seen today for annual exam.  Diagnoses and all orders for this visit:  Encounter for Medicare annual wellness exam- Women's Health Maintenance Plan Advised monthly breast exam and annual mammogram Advised dental  exam every six months Discussed stress management Discussed pap smear screening guidelines    Controlled type 2 diabetes mellitus without complication, without long-term current use of insulin ([place])- well controlled hemoglobin a1c is at goal Continue exercise Lipids monitored and renal function in range On metformin On simvastatin On asa 81mg  Reviewed diabetic foot care Emphasized importance of eye and dental exam    -     Hemoglobin A1c -     Comprehensive metabolic panel -     Lipid panel -     Ambulatory referral to Ophthalmology -     metFORMIN (GLUCOPHAGE) 500 MG tablet; TAKE 1 TABLET(500 MG) BY MOUTH TWICE DAILY WITH A MEAL  Need for tetanus booster  Flu vaccine need  Essential hypertension -     metoprolol succinate (TOPROL-XL) 50 MG 24 hr tablet; TAKE 1 TABLET BY MOUTH EVERY DAY WITH OR IMMEDIATELY FOLLOWING A MEAL  Age-related osteoporosis without current pathological fracture- will order DEXA scan for July 2020 at the six month follow up  Continue vitamin D supp -     Cholecalciferol (VITAMIN D3) 50 MCG (2000 UT) TABS; Take 1 tablet by mouth daily.  Other orders -     Flu Vaccine QUAD 36+ mos IM -     Td : Tetanus/diphtheria >7yo Preservative  free      Return in about 6 months (around 09/01/2018) for diabetes .  Future Appointments  Date Time Provider [place]  07/21/2018  9:00 AM [facility] 3 [facility] None  07/21/2018  9:30 AM Truitt Merle, MD [facility] None    Patient Instructions       If you have lab work done today you will be contacted with your lab results within the next 2 weeks.  If you have not heard from [place] then please contact us. The fastest way to get your results is to register for My  Chart.   IF you received an x-ray today, you will receive an invoice from [facility]. Please contact [facility] at [phone] with questions or concerns regarding your invoice.   IF you received labwork today, you will receive an invoice from [facility]. Please contact [facility] at [phone] with questions or concerns regarding your invoice.   Our billing staff will not be able to assist you with questions regarding bills from these companies.  You will be contacted with the lab results as soon as they are available. The fastest way to get your results is to activate your My Chart account. Instructions are located on the last page of this paperwork. If you have not heard from [place] regarding the results in 2 weeks, please contact this office.      Diabetic Retinopathy Diabetic retinopathy is a disease of the retina. The retina is a light-sensitive membrane at the back of the eye. Retinopathy is a complication of diabetes (diabetes mellitus) and a common cause of bad eyesight (visual impairment). It can eventually cause blindness. Early detection and treatment of diabetic retinopathy is important in keeping your eyes healthy and preventing further damage to them. What are the causes? Diabetic retinopathy is caused by blood sugar (glucose) levels that are too high for an extended period of time. High blood glucose over an extended period of time can:  Damage small blood vessels in the retina, allowing blood to leak through the vessel walls.  Cause new, abnormal blood vessels to grow on the retina. This can scar the retina in the advanced stage of diabetic retinopathy. What increases  the risk? You are more likely to develop this condition if:  You have had diabetes for a long time.  You have poorly controlled blood glucose.  You have high blood pressure. What are the signs or symptoms? In the early stages of diabetic retinopathy, there are often no symptoms. As the  condition gets worse, symptoms may include:  Blurred vision. This is usually caused by swelling due to abnormal blood glucose levels. The blurriness may go away when blood glucose levels return to normal.  Moving specks or dark spots (floaters) in your vision. These can be caused by a small amount of bleeding (hemorrhage) from retinal blood vessels.  Missing parts of your field of vision, such as vision at the sides of the eyes. This can be caused by larger retinal hemorrhages.  Difficulty reading.  Double vision.  Pain in one or both eyes.  Feeling pressure in one or both eyes.  Trouble seeing straight lines. Straight lines may not look straight.  Redness of the eyes that does not go away. How is this diagnosed? This condition may be diagnosed with an eye exam in which your eye care specialist puts drops in your eyes that enlarge (dilate) your pupils. This lets your health care provider examine your retina and check for changes in your retinal blood vessels. How is this treated? This condition may be treated by:  Keeping your blood glucose and blood pressure within a target range.  Using a type of laser beam to seal your retinal blood vessels. This stops them from bleeding and decreases pressure in your eye.  Getting shots of medicine in the eye to reduce swelling of the center of the retina (macula). You may be given: ? Anti-VEGF medicine. This medicine can help slow vision loss, and may even improve vision. ? Steroid medicine. Follow these instructions at home:   Follow your diabetes management plan as directed by your health care provider. This may include exercising regularly and eating a healthy diet.  Keep your blood glucose level and your blood pressure in your target range, as directed by your health care provider.  Check your blood glucose as often as directed.  Take over the counter and prescription medicines only as told by your health care provider. This includes  insulin and oral diabetes medicine.  Get your eyes checked at least once every year. An eye specialist can usually see diabetic retinopathy developing long before it starts to cause problems. In many cases, it can be treated to prevent complications from occurring.  Do not use any products that contain nicotine or tobacco, such as cigarettes and e-cigarettes. If you need help quitting, ask your health care provider.  Keep all follow-up visits as told by your health care provider. This is important. Contact a health care provider if:  You notice gradual blurring or other changes in your vision over time.  You notice that your glasses or contact lenses do not make things look as sharp as they once did.  You have trouble reading or seeing details at a distance with either eye.  You notice a change in your vision or notice that parts of your field of vision appear missing or hazy.  You suddenly see moving specks or dark spots in the field of vision of either eye. Get help right away if:  You have sudden pain or pressure in one or both eyes.  You suddenly lose vision or a curtain or veil seems to come across your eyes.  You  have a sudden burst of floaters in your vision. Summary  Diabetic retinopathy is a disease of the retina. The retina is a light-sensitive membrane at the back of the eye. Retinopathy is a complication of diabetes.  Get your eyes checked at least once every year. An eye specialist can usually see diabetic retinopathy developing long before it starts to cause problems. In many cases, it can be treated to prevent complications from occurring.  Keep your blood glucose and your blood pressure in target range. Follow your diabetes management plan as directed by your health care provider.  Protect your eyes. Wear sunglasses and eye protection when needed. This information is not intended to replace advice given to you by your health care provider. Make sure you discuss any  questions you have with your health care provider. Document Released: 02/08/2000 Document Revised: 03/25/2017 Document Reviewed: 03/17/2016 Elsevier Interactive Patient Education  [employer].    An after visit summary with all of these plans was given to the patient.

## 2018-03-03 NOTE — Patient Instructions (Addendum)
If you have lab work done today you will be contacted with your lab results within the next 2 weeks.  If you have not heard from [place] then please contact us. The fastest way to get your results is to register for My Chart.   IF you received an x-ray today, you will receive an invoice from [facility]. Please contact [facility] at [phone] with questions or concerns regarding your invoice.   IF you received labwork today, you will receive an invoice from [place]. Please contact [facility] at [phone] with questions or concerns regarding your invoice.   Our billing staff will not be able to assist you with questions regarding bills from these companies.  You will be contacted with the lab results as soon as they are available. The fastest way to get your results is to activate your My Chart account. Instructions are located on the last page of this paperwork. If you have not heard from [place] regarding the results in 2 weeks, please contact this office.      Diabetic Retinopathy Diabetic retinopathy is a disease of the retina. The retina is a light-sensitive membrane at the back of the eye. Retinopathy is a complication of diabetes (diabetes mellitus) and a common cause of bad eyesight (visual impairment). It can eventually cause blindness. Early detection and treatment of diabetic retinopathy is important in keeping your eyes healthy and preventing further damage to them. What are the causes? Diabetic retinopathy is caused by blood sugar (glucose) levels that are too high for an extended period of time. High blood glucose over an extended period of time can:  Damage small blood vessels in the retina, allowing blood to leak through the vessel walls.  Cause new, abnormal blood vessels to grow on the retina. This can scar the retina in the advanced stage of diabetic retinopathy. What increases the risk? You are more likely to develop this condition if:  You have had  diabetes for a long time.  You have poorly controlled blood glucose.  You have high blood pressure. What are the signs or symptoms? In the early stages of diabetic retinopathy, there are often no symptoms. As the condition gets worse, symptoms may include:  Blurred vision. This is usually caused by swelling due to abnormal blood glucose levels. The blurriness may go away when blood glucose levels return to normal.  Moving specks or dark spots (floaters) in your vision. These can be caused by a small amount of bleeding (hemorrhage) from retinal blood vessels.  Missing parts of your field of vision, such as vision at the sides of the eyes. This can be caused by larger retinal hemorrhages.  Difficulty reading.  Double vision.  Pain in one or both eyes.  Feeling pressure in one or both eyes.  Trouble seeing straight lines. Straight lines may not look straight.  Redness of the eyes that does not go away. How is this diagnosed? This condition may be diagnosed with an eye exam in which your eye care specialist puts drops in your eyes that enlarge (dilate) your pupils. This lets your health care provider examine your retina and check for changes in your retinal blood vessels. How is this treated? This condition may be treated by:  Keeping your blood glucose and blood pressure within a target range.  Using a type of laser beam to seal your retinal blood vessels. This stops them from bleeding and decreases pressure in your eye.  Getting shots of medicine in the  eye to reduce swelling of the center of the retina (macula). You may be given: ? Anti-VEGF medicine. This medicine can help slow vision loss, and may even improve vision. ? Steroid medicine. Follow these instructions at home:   Follow your diabetes management plan as directed by your health care provider. This may include exercising regularly and eating a healthy diet.  Keep your blood glucose level and your blood pressure in  your target range, as directed by your health care provider.  Check your blood glucose as often as directed.  Take over the counter and prescription medicines only as told by your health care provider. This includes insulin and oral diabetes medicine.  Get your eyes checked at least once every year. An eye specialist can usually see diabetic retinopathy developing long before it starts to cause problems. In many cases, it can be treated to prevent complications from occurring.  Do not use any products that contain nicotine or tobacco, such as cigarettes and e-cigarettes. If you need help quitting, ask your health care provider.  Keep all follow-up visits as told by your health care provider. This is important. Contact a health care provider if:  You notice gradual blurring or other changes in your vision over time.  You notice that your glasses or contact lenses do not make things look as sharp as they once did.  You have trouble reading or seeing details at a distance with either eye.  You notice a change in your vision or notice that parts of your field of vision appear missing or hazy.  You suddenly see moving specks or dark spots in the field of vision of either eye. Get help right away if:  You have sudden pain or pressure in one or both eyes.  You suddenly lose vision or a curtain or veil seems to come across your eyes.  You have a sudden burst of floaters in your vision. Summary  Diabetic retinopathy is a disease of the retina. The retina is a light-sensitive membrane at the back of the eye. Retinopathy is a complication of diabetes.  Get your eyes checked at least once every year. An eye specialist can usually see diabetic retinopathy developing long before it starts to cause problems. In many cases, it can be treated to prevent complications from occurring.  Keep your blood glucose and your blood pressure in target range. Follow your diabetes management plan as directed by  your health care provider.  Protect your eyes. Wear sunglasses and eye protection when needed. This information is not intended to replace advice given to you by your health care provider. Make sure you discuss any questions you have with your health care provider. Document Released: 02/08/2000 Document Revised: 03/25/2017 Document Reviewed: 03/17/2016 Elsevier Interactive Patient Education  2019 [employer].

## 2018-03-04 LAB — HEMOGLOBIN A1C
Est. average glucose Bld gHb Est-mCnc: 171 mg/dL
Hgb A1c MFr Bld: 7.6 % — ABNORMAL HIGH (ref 4.8–5.6)

## 2018-03-04 LAB — COMPREHENSIVE METABOLIC PANEL
A/G RATIO: 1.5 (ref 1.2–2.2)
ALBUMIN: 4.4 g/dL (ref 3.6–4.8)
ALK PHOS: 70 IU/L (ref 39–117)
ALT: 38 IU/L — ABNORMAL HIGH (ref 0–32)
AST: 27 IU/L (ref 0–40)
BUN / CREAT RATIO: 13 (ref 12–28)
BUN: 12 mg/dL (ref 8–27)
Bilirubin Total: 0.5 mg/dL (ref 0.0–1.2)
CO2: 24 mmol/L (ref 20–29)
Calcium: 9.8 mg/dL (ref 8.7–10.3)
Chloride: 101 mmol/L (ref 96–106)
Creatinine, Ser: 0.91 mg/dL (ref 0.57–1.00)
GFR calc non Af Amer: 68 mL/min/{1.73_m2} (ref >59)
GFR, EST AFRICAN AMERICAN: 78 mL/min/{1.73_m2} (ref >59)
Globulin, Total: 3 g/dL (ref 1.5–4.5)
Glucose: 157 mg/dL — ABNORMAL HIGH (ref 65–99)
POTASSIUM: 4.3 mmol/L (ref 3.5–5.2)
SODIUM: 141 mmol/L (ref 134–144)
TOTAL PROTEIN: 7.4 g/dL (ref 6.0–8.5)

## 2018-03-04 LAB — LIPID PANEL
CHOLESTEROL TOTAL: 145 mg/dL (ref 100–199)
Chol/HDL Ratio: 4.3 ratio (ref 0.0–4.4)
HDL: 34 mg/dL — ABNORMAL LOW (ref >39)
LDL Calculated: 60 mg/dL (ref 0–99)
Triglycerides: 253 mg/dL — ABNORMAL HIGH (ref 0–149)
VLDL CHOLESTEROL CAL: 51 mg/dL — AB (ref 5–40)

## 2018-07-15 ENCOUNTER — Telehealth: Payer: Self-pay | Admitting: Hematology

## 2018-07-15 NOTE — Telephone Encounter (Signed)
Called patient regarding upcoming Web[facility] appointment, patient's daughter will need this to be a telephone visit and it has been rescheduled for 05/26.

## 2018-07-16 NOTE — Progress Notes (Signed)
[place]   [phone] [phone]   Clinic Follow up Note   Patient Care Team: Forrest Moron, MD as PCP - General (Internal Medicine) Truitt Merle, MD as Consulting Physician (Hematology)   I connected with [name] on 07/20/2018 at 10:45 AM EDT by telephone visit and verified that I am speaking with the correct person using two identifiers.  I discussed the limitations, risks, security and privacy concerns of performing an evaluation and management service by telephone and the availability of in person appointments. I also discussed with the patient that there may be a patient responsible charge related to this service. The patient expressed understanding and agreed to proceed.   Other persons participating in the visit and their role in the encounter:  Her daughter   Patient's location:  Her home  Provider's location:  My Office   CHIEF COMPLAINT: Follow up right breast cancer  SUMMARY OF ONCOLOGIC HISTORY: Oncology History   Breast cancer of upper-outer quadrant of right female breast [facility])   Staging form: Breast, AJCC 7th Edition   - Clinical stage from 04/22/2010: Stage IIB (T3, N0, M0) - Signed by Truitt Merle, MD on 01/21/2016   - Pathologic stage from 09/11/2010: Stage IA (yT1c, N0, cM0) - Signed by Truitt Merle, MD on 01/21/2016      Breast cancer of upper-outer quadrant of right female breast ([place])   04/22/2010 Initial Diagnosis    Breast cancer of upper-outer quadrant of right female breast ([place])    04/22/2010 Receptors her2     ER 50% positive, PR negative, HER-2 positive    04/22/2010 Initial Biopsy     Right  Breast mass pops A showed invasive ductal carcinoma, G3    04/25/2010 Imaging    Breast MRI showed a 7.7 cm abnormal linear enhancement and confluent masses in the upper outer quadrant of the right breast which correlate with the recently biopsied breast cancer.    05/09/2010 Imaging    PET scan was  Negative for metastatic  adenopathy ordistant metastasis.    04/2010 -  Chemotherapy    She received 4 cycle of Abraxane and Herceptin every 3 weeks     09/11/2010 Surgery     Right breast mastectomy and sentinel lymph node biopsy    09/2010 - 08/2017 Anti-estrogen oral therapy     Adjuvant anastrozole 1 mg daily      CURRENT THERAPY:  Surveillance   INTERVAL HISTORY:  [name] is here for a follow up of right breast cancer. She was able to identify herself by birth date. Her daughter is there to help her translate. She notes she is doing well. She denies any new pain. She has chronic back pain and shooting breast pain. She completed her Anastrozole last summer. She is still on Fosamax.    REVIEW OF SYSTEMS:   Constitutional: Denies fevers, chills or abnormal weight loss Eyes: Denies blurriness of vision Ears, nose, mouth, throat, and face: Denies mucositis or sore throat Respiratory: Denies cough, dyspnea or wheezes Cardiovascular: Denies palpitation, chest discomfort or lower extremity swelling Gastrointestinal:  Denies nausea, heartburn or change in bowel habits Skin: Denies abnormal skin rashes MKS: (+) Chronic back pain  Lymphatics: Denies new lymphadenopathy or easy bruising Neurological:Denies numbness, tingling or new weaknesses Behavioral/Psych: Mood is stable, no new changes  BREAST: (+) right breast shooting pain  All other systems were reviewed with the patient and are negative.  MEDICAL HISTORY:  Past Medical History:  Diagnosis Date  .  Anxiety    ATIVAN HELPS PT TO SLEEP  . Breast cancer ([place])    breast right side -COMPLETED CHEMO WITH DR. Collier Salina RUBIN--EXCEPT ARIMIDEX  . Cough    PT HAS HAD COUGH FOR PAST MONTH-WHITE PHLEGM  . Hyperlipidemia   . Hypertension   . Indigestion    TAKING ZANTAC  . Personal history of chemotherapy 2012    SURGICAL HISTORY: Past Surgical History:  Procedure Laterality Date  . MASTECTOMY  09/12/10   right  . PORT-A-CATH REMOVAL  03/19/2011    Procedure: REMOVAL PORT-A-CATH;  Surgeon: Stark Klein, MD;  Location: [facility];  Service: General;  Laterality: N/A;  . PORTACATH PLACEMENT      I have reviewed the social history and family history with the patient and they are unchanged from previous note.  ALLERGIES:  is allergic to asa buff (mag [buffered aspirin].  MEDICATIONS:  Current Outpatient Medications  Medication Sig Dispense Refill  . alendronate (FOSAMAX) 35 MG tablet Take 1 tablet (35 mg total) by mouth every 7 (seven) days. Take with a full glass of water on an empty stomach. 12 tablet 4  . Cholecalciferol (VITAMIN D3) 50 MCG (2000 UT) TABS Take 1 tablet by mouth daily. 90 tablet 2  . metFORMIN (GLUCOPHAGE) 500 MG tablet TAKE 1 TABLET(500 MG) BY MOUTH TWICE DAILY WITH A MEAL 180 tablet 1  . metoprolol succinate (TOPROL-XL) 50 MG 24 hr tablet TAKE 1 TABLET BY MOUTH EVERY DAY WITH OR IMMEDIATELY FOLLOWING A MEAL 180 tablet 1  . Multiple Vitamin (MULITIVITAMIN WITH MINERALS) TABS Take 2 tablets by mouth daily. Reported on 08/30/2015    . simvastatin (ZOCOR) 20 MG tablet Take 1 tablet (20 mg total) by mouth at bedtime. 90 tablet 4   No current facility-administered medications for this visit.     PHYSICAL EXAMINATION: ECOG PERFORMANCE STATUS: 1 - Symptomatic but completely ambulatory  No vitals taken today, Exam not performed today  LABORATORY DATA:  I have reviewed the data as listed CBC Latest Ref Rng & Units 07/22/2017 01/22/2017 07/22/2016  WBC 3.9 - 10.3 K/uL 7.0 7.3 8.1  Hemoglobin 11.6 - 15.9 g/dL 11.2(L) 10.9(L) 11.2(L)  Hematocrit 34.8 - 46.6 % 36.5 35.0 37.1  Platelets 145 - 400 K/uL 272 291 260     CMP Latest Ref Rng & Units 03/03/2018 08/18/2017 07/22/2017  Glucose 65 - 99 mg/dL 157(H) 229(H) 177(H)  BUN 8 - 27 mg/dL '12 19 13  ' Creatinine 0.57 - 1.00 mg/dL 0.91 0.84 0.89  Sodium 134 - 144 mmol/L 141 142 140  Potassium 3.5 - 5.2 mmol/L 4.3 4.6 4.3  Chloride 96 - 106 mmol/L 101 105 105  CO2 20 - 29 mmol/L '24 23  24  ' Calcium 8.7 - 10.3 mg/dL 9.8 9.4 9.2  Total Protein 6.0 - 8.5 g/dL 7.4 6.9 7.4  Total Bilirubin 0.0 - 1.2 mg/dL 0.5 0.3 0.6  Alkaline Phos 39 - 117 IU/L 70 58 58  AST 0 - 40 IU/L '27 14 19  ' ALT 0 - 32 IU/L 38(H) 24 30      RADIOGRAPHIC STUDIES: I have personally reviewed the radiological images as listed and agreed with the findings in the report. No results found.   ASSESSMENT & PLAN:  [name] is a 63 y.o. female with   1.  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 She is s/p neo-adjuvant chemo Abraxane and Herceptin, but did not complete 1 year Herceptin due  to side effects. She is s/p right breast mastectomy and 7 years of adjuvant anti-estrogen therapy with anastrozole.  -She still has chronic back pain also breast pain from surgery, which is stable.  -She is clinically doing well. 11/2017 Left mammogram was benign. There is no clinical concern for recurrence.  -Continue surveillance, Next mammogram in 11/2018 -F/u in 6 months   2. Microcytic Anemia, Hemoglobin E trait -chronic since 2017, overall mild  -iron study and ferritin on 01/22/2017 was normal -TSH normal in 2017  -I checked hemoglobin electrophoresis to rule out beta thalassemia. Hg A2 4, Hg A 79, Hg variant 16.4, consistent with Hemoglobin E trait. -We discussed this is genetic disorder, and I encouraged her children to be tested   3. Osteoporosis  -08/2016 Bone Scan showed Osteoporosis in the right femur neck with a T-Score of -2.7. -Continue Vitamin D and start calcium 500-669m 1-2 times a day. -She knows to watch for falls due to her right hip being at high risk for fracture.  -She has been taking Fosamax since 2012 and has had partial response. She will continue Fosamax.  -She is due for DEXA this year.   4. HTN and DM -She will continue follow-up with her primary care physician  And continue medication  5. Cancer  screening  -She had colonoscopy more than 10 years ago, I recommend her to have a repeat one. She is very reluctant to have it.  Plan: -Left breast Mammogram and DEXA in 11/2018  -Lab and f/u in 6 months    No problem-specific Assessment & Plan notes found for this encounter.   Orders Placed This Encounter  Procedures  . MM Digital Screening Unilat L    Standing Status:   Future    Standing Expiration Date:   07/20/2019    Order Specific Question:   Reason for Exam (SYMPTOM  OR DIAGNOSIS REQUIRED)    Answer:   screening    Order Specific Question:   Preferred imaging location?    Answer:   [facility] . DG Bone Density    Standing Status:   Future    Standing Expiration Date:   07/20/2019    Order Specific Question:   Reason for Exam (SYMPTOM  OR DIAGNOSIS REQUIRED)    Answer:   screening    Order Specific Question:   [facility]?    [facility]  I discussed the assessment and treatment plan with the patient. The patient was provided an opportunity to ask questions and all were answered. The patient agreed with the plan and demonstrated an understanding of the instructions.  The patient was advised to call back or seek an in-person evaluation if the symptoms worsen or if the condition fails to improve as anticipated.  I provided 12 minutes of non face-to-face telephone visit time during this encounter, and > 50% was spent counseling as documented under my assessment & plan.    YTruitt Merle MD 07/20/2018   I, AJoslyn Devon am acting as scribe for YTruitt Merle MD.   I have reviewed the above documentation for accuracy and completeness, and I agree with the above.

## 2018-07-20 ENCOUNTER — Encounter: Payer: Self-pay | Admitting: Hematology

## 2018-07-20 ENCOUNTER — Inpatient Hospital Stay: Payer: Medicare Other | Attending: Hematology | Admitting: Hematology

## 2018-07-20 ENCOUNTER — Telehealth: Payer: Self-pay | Admitting: Hematology

## 2018-07-20 DIAGNOSIS — E2839 Other primary ovarian failure: Secondary | ICD-10-CM

## 2018-07-20 DIAGNOSIS — Z79811 Long term (current) use of aromatase inhibitors: Secondary | ICD-10-CM

## 2018-07-20 DIAGNOSIS — Z1231 Encounter for screening mammogram for malignant neoplasm of breast: Secondary | ICD-10-CM

## 2018-07-20 DIAGNOSIS — Z170 Estrogen receptor positive status [ER+]: Secondary | ICD-10-CM

## 2018-07-20 DIAGNOSIS — Z79899 Other long term (current) drug therapy: Secondary | ICD-10-CM

## 2018-07-20 DIAGNOSIS — C50411 Malignant neoplasm of upper-outer quadrant of right female breast: Secondary | ICD-10-CM

## 2018-07-20 NOTE — Telephone Encounter (Signed)
Scheduled appt per 5/26 los. ° °A calendar will be mailed out. °

## 2018-07-21 ENCOUNTER — Ambulatory Visit: Payer: Medicare Other | Admitting: Hematology

## 2018-07-21 ENCOUNTER — Other Ambulatory Visit: Payer: Medicare Other

## 2018-08-12 ENCOUNTER — Telehealth: Payer: Self-pay | Admitting: Family Medicine

## 2018-08-12 ENCOUNTER — Other Ambulatory Visit: Payer: Self-pay | Admitting: Family Medicine

## 2018-08-12 ENCOUNTER — Other Ambulatory Visit: Payer: Self-pay

## 2018-08-12 DIAGNOSIS — I10 Essential (primary) hypertension: Secondary | ICD-10-CM

## 2018-08-12 MED ORDER — METOPROLOL SUCCINATE ER 50 MG PO TB24: ORAL_TABLET | ORAL | 0 refills | Status: DC

## 2018-08-12 NOTE — Telephone Encounter (Signed)
Copied from [place] [id number]. Topic: Quick Communication - Rx Refill/Question >> Aug 12, 2018  9:36 AM Carolyn Stare wrote: Medication metoprolol succinate (TOPROL-XL) 50 MG 24 hr tablet  Preferred Pharmacy [facility]   Agent: Please be advised that RX refills may take up to 3 business days. We ask that you follow-up with your pharmacy.

## 2018-08-12 NOTE — Telephone Encounter (Signed)
Rx sent to pharmacy   

## 2018-09-01 ENCOUNTER — Encounter: Payer: Self-pay | Admitting: Family Medicine

## 2018-09-01 ENCOUNTER — Other Ambulatory Visit: Payer: Self-pay | Admitting: Family Medicine

## 2018-09-01 ENCOUNTER — Other Ambulatory Visit: Payer: Self-pay

## 2018-09-01 ENCOUNTER — Ambulatory Visit (INDEPENDENT_AMBULATORY_CARE_PROVIDER_SITE_OTHER): Payer: Medicare Other | Admitting: Family Medicine

## 2018-09-01 VITALS — BP 129/76 | HR 72 | Temp 98.2°F | Resp 17 | Ht <= 58 in | Wt 127.8 lb

## 2018-09-01 DIAGNOSIS — R079 Chest pain, unspecified: Secondary | ICD-10-CM | POA: Diagnosis not present

## 2018-09-01 DIAGNOSIS — E119 Type 2 diabetes mellitus without complications: Secondary | ICD-10-CM | POA: Diagnosis not present

## 2018-09-01 DIAGNOSIS — R21 Rash and other nonspecific skin eruption: Secondary | ICD-10-CM | POA: Diagnosis not present

## 2018-09-01 DIAGNOSIS — I10 Essential (primary) hypertension: Secondary | ICD-10-CM

## 2018-09-01 DIAGNOSIS — E1165 Type 2 diabetes mellitus with hyperglycemia: Secondary | ICD-10-CM

## 2018-09-01 DIAGNOSIS — R739 Hyperglycemia, unspecified: Secondary | ICD-10-CM

## 2018-09-01 DIAGNOSIS — Z5189 Encounter for other specified aftercare: Secondary | ICD-10-CM

## 2018-09-01 DIAGNOSIS — Z758 Other problems related to medical facilities and other health care: Secondary | ICD-10-CM

## 2018-09-01 DIAGNOSIS — Z789 Other specified health status: Secondary | ICD-10-CM

## 2018-09-01 LAB — POCT GLYCOSYLATED HEMOGLOBIN (HGB A1C): Hemoglobin A1C: 8 % — AB (ref 4.0–5.6)

## 2018-09-01 MED ORDER — METFORMIN HCL ER 500 MG PO TB24: 500.0000 mg | ORAL_TABLET | Freq: Two times a day (BID) | ORAL | 0 refills | Status: DC

## 2018-09-01 MED ORDER — BLOOD GLUCOSE METER KIT: PACK | 0 refills | Status: DC

## 2018-09-01 MED ORDER — METOPROLOL SUCCINATE ER 50 MG PO TB24: ORAL_TABLET | ORAL | 1 refills | Status: DC

## 2018-09-01 MED ORDER — FAMOTIDINE 20 MG PO TABS: 20.0000 mg | ORAL_TABLET | Freq: Two times a day (BID) | ORAL | 1 refills | Status: DC

## 2018-09-01 MED ORDER — BETAMETHASONE DIPROPIONATE 0.05 % EX CREA: TOPICAL_CREAM | Freq: Two times a day (BID) | CUTANEOUS | 0 refills | Status: DC

## 2018-09-01 NOTE — Patient Instructions (Addendum)
If you have lab work done today you will be contacted with your lab results within the next 2 weeks.  If you have not heard from [place] then please contact us. The fastest way to get your results is to register for My Chart.   IF you received an x-ray today, you will receive an invoice from [facility]. Please contact [facility] at [phone] with questions or concerns regarding your invoice.   IF you received labwork today, you will receive an invoice from [place]. Please contact [facility] at [phone] with questions or concerns regarding your invoice.   Our billing staff will not be able to assist you with questions regarding bills from these companies.  You will be contacted with the lab results as soon as they are available. The fastest way to get your results is to activate your My Chart account. Instructions are located on the last page of this paperwork. If you have not heard from [place] regarding the results in 2 weeks, please contact this office.     Blood Glucose Monitoring, Adult Monitoring your blood sugar (glucose) is an important part of managing your diabetes (diabetes mellitus). Blood glucose monitoring involves checking your blood glucose as often as directed and keeping a record (log) of your results over time. Checking your blood glucose regularly and keeping a blood glucose log can:  Help you and your health care provider adjust your diabetes management plan as needed, including your medicines or insulin.  Help you understand how food, exercise, illnesses, and medicines affect your blood glucose.  Let you know what your blood glucose is at any time. You can quickly find out if you have low blood glucose (hypoglycemia) or high blood glucose (hyperglycemia). Your health care provider will set individualized treatment goals for you. Your goals will be based on your age, other medical conditions you have, and how you respond to diabetes treatment.  Generally, the goal of treatment is to maintain the following blood glucose levels:  Before meals (preprandial): 80-130 mg/dL (4.4-7.2 mmol/L).  After meals (postprandial): below 180 mg/dL (10 mmol/L).  A1c level: less than 7%. Supplies needed:  Blood glucose meter.  Test strips for your meter. Each meter has its own strips. You must use the strips that came with your meter.  A needle to prick your finger (lancet). Do not use a lancet more than one time.  A device that holds the lancet (lancing device).  A journal or log book to write down your results. How to check your blood glucose  1. Wash your hands with soap and water. 2. Prick the side of your finger (not the tip) with the lancet. Use a different finger each time. 3. Gently rub the finger until a small drop of blood appears. 4. Follow instructions that come with your meter for inserting the test strip, applying blood to the strip, and using your blood glucose meter. 5. Write down your result and any notes. Some meters allow you to use areas of your body other than your finger (alternative sites) to test your blood. The most common alternative sites are:  Forearm.  Thigh.  Palm of the hand. If you think you may have hypoglycemia, or if you have a history of not knowing when your blood glucose is getting low (hypoglycemia unawareness), do not use alternative sites. Use your finger instead. Alternative sites may not be as accurate as the fingers, because blood flow is slower in these areas. This means that the  result you get may be delayed, and it may be different from the result that you would get from your finger. Follow these instructions at home: Blood glucose log   Every time you check your blood glucose, write down your result. Also write down any notes about things that may be affecting your blood glucose, such as your diet and exercise for the day. This information can help you and your health care provider: ? Look  for patterns in your blood glucose over time. ? Adjust your diabetes management plan as needed.  Check if your meter allows you to download your records to a computer. Most glucose meters store a record of glucose readings in the meter. If you have type 1 diabetes:  Check your blood glucose 2 or more times a day.  Also check your blood glucose: ? Before every insulin injection. ? Before and after exercise. ? Before meals. ? 2 hours after a meal. ? Occasionally between 2:00 a.m. and 3:00 a.m., as directed. ? Before potentially dangerous tasks, like driving or using heavy machinery. ? At bedtime.  You may need to check your blood glucose more often, up to 6-10 times a day, if you: ? Use an insulin pump. ? Need multiple daily injections (MDI). ? Have diabetes that is not well-controlled. ? Are ill. ? Have a history of severe hypoglycemia. ? Have hypoglycemia unawareness. If you have type 2 diabetes:  If you take insulin or other diabetes medicines, check your blood glucose 2 or more times a day.  If you are on intensive insulin therapy, check your blood glucose 4 or more times a day. Occasionally, you may also need to check between 2:00 a.m. and 3:00 a.m., as directed.  Also check your blood glucose: ? Before and after exercise. ? Before potentially dangerous tasks, like driving or using heavy machinery.  You may need to check your blood glucose more often if: ? Your medicine is being adjusted. ? Your diabetes is not well-controlled. ? You are ill. General tips  Always keep your supplies with you.  If you have questions or need help, all blood glucose meters have a 24-hour "hotline" phone number that you can call. You may also contact your health care provider.  After you use a few boxes of test strips, adjust (calibrate) your blood glucose meter by following instructions that came with your meter. Contact a health care provider if:  Your blood glucose is at or above 240  mg/dL (13.3 mmol/L) for 2 days in a row.  You have been sick or have had a fever for 2 days or longer, and you are not getting better.  You have any of the following problems for more than 6 hours: ? You cannot eat or drink. ? You have nausea or vomiting. ? You have diarrhea. Get help right away if:  Your blood glucose is lower than 54 mg/dL (3 mmol/L).  You become confused or you have trouble thinking clearly.  You have difficulty breathing.  You have moderate or large ketone levels in your urine. Summary  Monitoring your blood sugar (glucose) is an important part of managing your diabetes (diabetes mellitus).  Blood glucose monitoring involves checking your blood glucose as often as directed and keeping a record (log) of your results over time.  Your health care provider will set individualized treatment goals for you. Your goals will be based on your age, other medical conditions you have, and how you respond to diabetes treatment.  Every time  you check your blood glucose, write down your result. Also write down any notes about things that may be affecting your blood glucose, such as your diet and exercise for the day. This information is not intended to replace advice given to you by your health care provider. Make sure you discuss any questions you have with your health care provider. Document Released: 02/13/2003 Document Revised: 12/04/2017 Document Reviewed: 07/23/2015 Elsevier Patient Education  2020 [employer].

## 2018-09-01 NOTE — Progress Notes (Signed)
Established Patient Office Visit  Subjective:  Patient ID: [name], female    DOB: 12/23/1955  Age: 63 y.o. MRN: [id number]  CC:  Chief Complaint  Patient presents with   Diabetes    6 month recheck   Medication Refill    metformin and metoprolol - 90 day supply    HPI [name] presents for   Diabetes Mellitus  This is a 63 year old female who presents for follow-up for type 2 diabetes mellitus.  Recent visit her A1c was noted to have increased In June 2019 her A1c was 6.9 and in January 2020 her A1c 7.6 She is here with her daughter to help to translate Patient was not getting very much exercise and so last visit her medications were increased The patient denies any episodes of hypoglycemia.  She was not able to get any sugar readings at home because she had no testing supplies.  She also denies any symptoms such as polyuria, polydipsia, polyphagia, nausea, vomiting, numbness and tingling. Her medications include metformin 500 mg twice a day with meals.  She is also on a statin.  She takes metoprolol. Component     Latest Ref Rng & Units 08/18/2017 03/03/2018 09/01/2018  Hemoglobin A1C     4.0 - 5.6 % 6.9 (H) 7.6 (H) 8.0 (A)  Est. average glucose Bld gHb Est-mCnc     mg/dL 151 171    Hypertension: Patient here for follow-up of elevated blood pressure. She is not exercising and is adherent to low salt diet.  Blood pressure is well controlled at home. Cardiac symptoms none. Patient denies chest pain, claudication, dyspnea, exertional chest pressure/discomfort, fatigue, irregular heart beat and near-syncope.  Cardiovascular risk factors: diabetes mellitus and hypertension. Use of agents associated with hypertension: none. History of target organ damage: none. BP Readings from Last 3 Encounters:  09/01/18 129/76  03/03/18 135/78  08/18/17 120/70   Skin rash Pt was bitten by mosquitos and when she scratches gets a rash No fevers or chills Uses betamethasone. Needs  refill.   Past Medical History:  Diagnosis Date   Anxiety    ATIVAN HELPS PT TO SLEEP   Breast cancer ([place])    breast right side -COMPLETED CHEMO WITH DR. Collier Salina RUBIN--EXCEPT ARIMIDEX   Cough    PT HAS HAD COUGH FOR PAST MONTH-WHITE PHLEGM   Hyperlipidemia    Hypertension    Indigestion    TAKING ZANTAC   Personal history of chemotherapy 2012    Past Surgical History:  Procedure Laterality Date   MASTECTOMY  09/12/10   right   PORT-A-CATH REMOVAL  03/19/2011   Procedure: REMOVAL PORT-A-CATH;  Surgeon: Stark Klein, MD;  Location: [facility];  Service: General;  Laterality: N/A;   PORTACATH PLACEMENT      Family History  Problem Relation Age of Onset   Hypertension Mother    Diabetes Mother    Cancer Father        unknown type cancer    Diabetes Sister    Hypertension Sister    Cancer Sister    Thyroid disease Sister    Breast cancer Neg Hx     Social History   Socioeconomic History   Marital status: Single    Spouse name: Not on file   Number of children: Not on file   Years of education: Not on file   Highest education level: Not on file  Occupational History   Not on file  Social Needs   Financial resource strain: Not  on file   Food insecurity    Worry: Not on file    Inability: Not on file   Transportation needs    Medical: Not on file    Non-medical: Not on file  Tobacco Use   Smoking status: Never Smoker   Smokeless tobacco: Never Used  Substance and Sexual Activity   Alcohol use: No   Drug use: No   Sexual activity: Not Currently  Lifestyle   Physical activity    Days per week: Not on file    Minutes per session: Not on file   Stress: Not on file  Relationships   Social connections    Talks on phone: Not on file    Gets together: Not on file    Attends religious service: Not on file    Active member of club or organization: Not on file    Attends meetings of clubs or organizations: Not on file     Relationship status: Not on file   Intimate partner violence    Fear of current or ex partner: Not on file    Emotionally abused: Not on file    Physically abused: Not on file    Forced sexual activity: Not on file  Other Topics Concern   Not on file  Social History Narrative   Widowed for over 20 years. She has 4 children and 8 grandchildren. She lives with one of her daughters. She enjoys gardening and other yard work. She is not currently working.   She does not do routine exercise activity, but is always active.    Outpatient Medications Prior to Visit  Medication Sig Dispense Refill   alendronate (FOSAMAX) 35 MG tablet Take 1 tablet (35 mg total) by mouth every 7 (seven) days. Take with a full glass of water on an empty stomach. 12 tablet 4   Cholecalciferol (VITAMIN D3) 50 MCG (2000 UT) TABS Take 1 tablet by mouth daily. 90 tablet 2   Multiple Vitamin (MULITIVITAMIN WITH MINERALS) TABS Take 2 tablets by mouth daily. Reported on 08/30/2015     simvastatin (ZOCOR) 20 MG tablet Take 1 tablet (20 mg total) by mouth at bedtime. 90 tablet 4   metFORMIN (GLUCOPHAGE) 500 MG tablet TAKE 1 TABLET(500 MG) BY MOUTH TWICE DAILY WITH A MEAL 180 tablet 1   metoprolol succinate (TOPROL-XL) 50 MG 24 hr tablet TAKE 1 TABLET BY MOUTH EVERY DAY WITH OR IMMEDIATELY FOLLOWING A MEAL 30 tablet 0   No facility-administered medications prior to visit.     Allergies  Allergen Reactions   Asa Buff (Mag [Buffered Aspirin] Nausea And Vomiting    ROS Review of Systems Review of Systems  Constitutional: Negative for activity change, appetite change, chills and fever.  HENT: Negative for congestion, nosebleeds, trouble swallowing and voice change.   Respiratory: Negative for cough, shortness of breath and wheezing.   Gastrointestinal: Negative for diarrhea, nausea and vomiting.  Genitourinary: Negative for difficulty urinating, dysuria, flank pain and hematuria.  Musculoskeletal: Negative for back  pain, joint swelling and neck pain.  Neurological: Negative for dizziness, speech difficulty, light-headedness and numbness.  See HPI. All other review of systems negative.     Objective:    Physical Exam  BP 129/76 (BP Location: Left Arm, Patient Position: Sitting, Cuff Size: Normal)    Pulse 72    Temp 98.2 F (36.8 C) (Oral)    Resp 17    Ht '4\' 8"'  (1.422 m)    Wt 127 lb 12.8 oz (58  kg)    SpO2 100%    BMI 28.65 kg/m  Wt Readings from Last 3 Encounters:  09/01/18 127 lb 12.8 oz (58 kg)  03/03/18 129 lb 12.8 oz (58.9 kg)  08/18/17 130 lb 6.4 oz (59.1 kg)   Physical Exam  Constitutional: Oriented to person, place, and time. Appears well-developed and well-nourished.  HENT:  Head: Normocephalic and atraumatic.  Eyes: Conjunctivae and EOM are normal.  Cardiovascular: Normal rate, regular rhythm, normal heart sounds and intact distal pulses.  No murmur heard. Pulmonary/Chest: Effort normal and breath sounds normal. No stridor. No respiratory distress. Has no wheezes.  Neurological: Is alert and oriented to person, place, and time.  Skin: Skin is warm. Capillary refill takes less than 2 seconds.  Psychiatric: Has a normal mood and affect. Behavior is normal. Judgment and thought content normal.   ECG - nsr, no twi, no st elevation  Health Maintenance Due  Topic Date Due   OPHTHALMOLOGY EXAM  09/24/2017   HEMOGLOBIN A1C  06/02/2018   FOOT EXAM  08/19/2018   URINE MICROALBUMIN  08/19/2018    There are no preventive care reminders to display for this patient.  Lab Results  Component Value Date   TSH 1.22 08/30/2015   Lab Results  Component Value Date   WBC 7.0 07/22/2017   HGB 11.2 (L) 07/22/2017   HCT 36.5 07/22/2017   MCV 74.9 (L) 07/22/2017   PLT 272 07/22/2017   Lab Results  Component Value Date   NA 141 03/03/2018   K 4.3 03/03/2018   CHLORIDE 105 01/22/2017   CO2 24 03/03/2018   GLUCOSE 157 (H) 03/03/2018   BUN 12 03/03/2018   CREATININE 0.91 03/03/2018    BILITOT 0.5 03/03/2018   ALKPHOS 70 03/03/2018   AST 27 03/03/2018   ALT 38 (H) 03/03/2018   PROT 7.4 03/03/2018   ALBUMIN 4.4 03/03/2018   CALCIUM 9.8 03/03/2018   ANIONGAP 11 07/22/2017   EGFR >60 01/22/2017   Lab Results  Component Value Date   CHOL 145 03/03/2018   Lab Results  Component Value Date   HDL 34 (L) 03/03/2018   Lab Results  Component Value Date   LDLCALC 60 03/03/2018   Lab Results  Component Value Date   TRIG 253 (H) 03/03/2018   Lab Results  Component Value Date   CHOLHDL 4.3 03/03/2018   Lab Results  Component Value Date   HGBA1C 8.0 (A) 09/01/2018      Assessment & Plan:   Problem List Items Addressed This Visit      Cardiovascular and Mediastinum   Essential hypertension - Primary (Chronic)   Relevant Medications   metoprolol succinate (TOPROL-XL) 50 MG 24 hr tablet   Other Relevant Orders   Lipid panel   Comprehensive metabolic panel     Endocrine   Diabetes type 2, controlled (HCC) (Chronic)   Relevant Medications   metFORMIN (GLUCOPHAGE XR) 500 MG 24 hr tablet    Other Visit Diagnoses    Intermittent chest pain       Relevant Orders   EKG 12-Lead (Completed)   Skin rash       Relevant Medications   betamethasone dipropionate (DIPROLENE) 0.05 % cream     Hypertension: Good discussed changing medication to lisinopril in the future but for now we will continue metoprolol since patient stable on the medication Diabetes: Change metformin from immediate release to metformin XR 500 mg twice a day for more coverage, continue statin therapy and aspirin.  Discussed that metoprolol can mask symptoms of hypoglycemia so would advise that they check blood glucoses with this dose increase.  Demonstrated through pictures and videos how to check sugars and patient and her daughter seem nervous thus will order home health to do some teaching in the home environment on how to check sugars and to leave them some directions as well.  With proper  monitoring this patient has a good prognosis of maintaining a well controlled diabetes. Intermittent chest pain.  EKG normal sinus rhythm description and history of the chest pain is suggestive of reflux.  EKG was done today since some females with diabetes can present with atypical chest pain however her symptoms are reproducible and in the substernal to epigastric area associated with foods.  Reassured that this is not cardiac origin. Skin rash.  Patient has skin lesions in the area of where she has been bitten by mosquitoes thus will give betamethasone cream to help with skin irritation.  Meds ordered this encounter  Medications   betamethasone dipropionate (DIPROLENE) 0.05 % cream    Sig: Apply topically 2 (two) times daily.    Dispense:  30 g    Refill:  0   metFORMIN (GLUCOPHAGE XR) 500 MG 24 hr tablet    Sig: Take 1 tablet (500 mg total) by mouth 2 (two) times daily with a meal.    Dispense:  180 tablet    Refill:  0   metoprolol succinate (TOPROL-XL) 50 MG 24 hr tablet    Sig: TAKE 1 TABLET BY MOUTH EVERY DAY WITH OR IMMEDIATELY FOLLOWING A MEAL    Dispense:  90 tablet    Refill:  1   blood glucose meter kit and supplies    Sig: Dispense based on patient and insurance preference. Use up to three times daily as directed to check blood glucose. (FOR ICD-10 E10.9, E11.9).    Dispense:  1 each    Refill:  0    Order Specific Question:   Number of strips    Answer:   100    Order Specific Question:   Number of lancets    Answer:   100    Follow-up: No follow-ups on file.    Forrest Moron, MD

## 2018-09-02 LAB — COMPREHENSIVE METABOLIC PANEL
ALT: 31 IU/L (ref 0–32)
AST: 28 IU/L (ref 0–40)
Albumin/Globulin Ratio: 1.7 (ref 1.2–2.2)
Albumin: 4 g/dL (ref 3.8–4.8)
Alkaline Phosphatase: 54 IU/L (ref 39–117)
BUN/Creatinine Ratio: 14 (ref 12–28)
BUN: 12 mg/dL (ref 8–27)
Bilirubin Total: 0.6 mg/dL (ref 0.0–1.2)
CO2: 21 mmol/L (ref 20–29)
Calcium: 9.4 mg/dL (ref 8.7–10.3)
Chloride: 102 mmol/L (ref 96–106)
Creatinine, Ser: 0.88 mg/dL (ref 0.57–1.00)
GFR calc Af Amer: 81 mL/min/{1.73_m2} (ref >59)
GFR calc non Af Amer: 70 mL/min/{1.73_m2} (ref >59)
Globulin, Total: 2.4 g/dL (ref 1.5–4.5)
Glucose: 172 mg/dL — ABNORMAL HIGH (ref 65–99)
Potassium: 4.5 mmol/L (ref 3.5–5.2)
Sodium: 139 mmol/L (ref 134–144)
Total Protein: 6.4 g/dL (ref 6.0–8.5)

## 2018-09-02 LAB — LIPID PANEL
Chol/HDL Ratio: 4 ratio (ref 0.0–4.4)
Cholesterol, Total: 136 mg/dL (ref 100–199)
HDL: 34 mg/dL — ABNORMAL LOW (ref >39)
LDL Calculated: 65 mg/dL (ref 0–99)
Triglycerides: 183 mg/dL — ABNORMAL HIGH (ref 0–149)
VLDL Cholesterol Cal: 37 mg/dL (ref 5–40)

## 2018-09-02 LAB — MICROALBUMIN, URINE: Microalbumin, Urine: 13.2 ug/mL

## 2018-11-02 ENCOUNTER — Other Ambulatory Visit: Payer: Self-pay

## 2018-11-02 ENCOUNTER — Other Ambulatory Visit: Payer: Self-pay | Admitting: Family Medicine

## 2018-11-02 ENCOUNTER — Ambulatory Visit: Payer: Medicare Other

## 2018-11-02 DIAGNOSIS — M810 Age-related osteoporosis without current pathological fracture: Secondary | ICD-10-CM

## 2018-11-02 DIAGNOSIS — E782 Mixed hyperlipidemia: Secondary | ICD-10-CM

## 2018-11-02 DIAGNOSIS — E1165 Type 2 diabetes mellitus with hyperglycemia: Secondary | ICD-10-CM

## 2018-11-02 LAB — HEMOGLOBIN A1C
Est. average glucose Bld gHb Est-mCnc: 169 mg/dL
Hgb A1c MFr Bld: 7.5 % — ABNORMAL HIGH (ref 4.8–5.6)

## 2018-11-02 MED ORDER — SIMVASTATIN 20 MG PO TABS: 20.0000 mg | ORAL_TABLET | Freq: Every day | ORAL | 0 refills | Status: DC

## 2018-11-02 MED ORDER — ALENDRONATE SODIUM 35 MG PO TABS: 35.0000 mg | ORAL_TABLET | ORAL | 0 refills | Status: DC

## 2018-11-02 NOTE — Telephone Encounter (Signed)
Requested medication (s) are due for refill today yes  Requested medication (s) are on the active medication list: yes   Future visit scheduled: yes  Notes to clinic:  [place] and need her medication filled early   Requested Prescriptions  Pending Prescriptions Disp Refills   simvastatin (ZOCOR) 20 MG tablet 90 tablet 4    Sig: Take 1 tablet (20 mg total) by mouth at bedtime.     Cardiovascular:  Antilipid - Statins Failed - 11/02/2018 12:12 PM      Failed - HDL in normal range and within 360 days    HDL  Date Value Ref Range Status  09/01/2018 34 (L) >39 mg/dL Final         Failed - Triglycerides in normal range and within 360 days    Triglycerides  Date Value Ref Range Status  09/01/2018 183 (H) 0 - 149 mg/dL Final         Passed - Total Cholesterol in normal range and within 360 days    Cholesterol, Total  Date Value Ref Range Status  09/01/2018 136 100 - 199 mg/dL Final         Passed - LDL in normal range and within 360 days    LDL Calculated  Date Value Ref Range Status  09/01/2018 65 0 - 99 mg/dL Final         Passed - Patient is not pregnant      Passed - Valid encounter within last 12 months    Recent Outpatient Visits          2 months ago Essential hypertension   [facility] at [facility], Arlie Solomons, MD   8 months ago Encounter for [employer] annual wellness exam   Primary Care at [facility], Arlie Solomons, MD   1 year ago Essential hypertension   [facility], Damaris Hippo, PA-C   2 years ago Essential hypertension   [facility], Damaris Hippo, PA-C   2 years ago Essential hypertension   [facility] at [facility], Damaris Hippo, PA-C      Future Appointments            In 4 weeks Forrest Moron, MD [facility] at [place], [facility]            alendronate (FOSAMAX) 35 MG tablet 12 tablet 4    Sig: Take 1 tablet (35 mg total) by mouth every 7 (seven) days. Take with a full glass of water  on an empty stomach.     Endocrinology:  Bisphosphonates Failed - 11/02/2018 12:12 PM      Failed - Vitamin D in normal range and within 360 days    Vit D, 25-Hydroxy  Date Value Ref Range Status  08/16/2016 44.9 30.0 - 100.0 ng/mL Final    Comment:    Vitamin D deficiency has been defined by the [place] practice guideline as a level of serum 25-OH vitamin D less than 20 ng/mL (1,2). The Endocrine Society went on to further define vitamin D insufficiency as a level between 21 and 29 ng/mL (2). 1. IOM (Institute of Medicine). 2010. Dietary reference    intakes for calcium and D. [place]: The    [employer]. 2. Holick MF, Binkley C[place], Bischoff-Ferrari HA, et al.    Evaluation, treatment, and prevention of vitamin D    deficiency: an Endocrine Society  clinical practice    guideline. JCEM. 2011 Jul; 96(7):1911-30.          Passed - Ca in normal range and within 360 days    Calcium  Date Value Ref Range Status  09/01/2018 9.4 8.7 - 10.3 mg/dL Final  01/22/2017 9.7 8.4 - 10.4 mg/dL Final         Passed - Valid encounter within last 12 months    Recent Outpatient Visits          2 months ago Essential hypertension   Primary Care at [place], MD   8 months ago Encounter for Medicare annual wellness exam   Primary Care at [facility], Arlie Solomons, MD   1 year ago Essential hypertension   [facility] at [facility], Damaris Hippo, PA-C   2 years ago Essential hypertension   [facility], Damaris Hippo, PA-C   2 years ago Essential hypertension   [facility] at [facility], Damaris Hippo, PA-C      Future Appointments            In 4 weeks Forrest Moron, MD Primary Care at [place], [facility]

## 2018-11-02 NOTE — Telephone Encounter (Signed)
Medication Refill - Medication: simvastatin (ZOCOR) 20 MG tablet /alendronate (FOSAMAX) 35 MG tablet / Pt's daughter stated pt will be out of stated for 3 months and would like to request all her medications be refilled early if possible  Has the patient contacted their pharmacy? Yes.   (Agent: If no, request that the patient contact the pharmacy for the refill.) (Agent: If yes, when and what did the pharmacy advise?)  Preferred Pharmacy (with phone number or street name):  [facility], [place] [phone] (Phone) [phone] (Fax)     Agent: Please be advised that RX refills may take up to 3 business days. We ask that you follow-up with your pharmacy.

## 2018-11-02 NOTE — Telephone Encounter (Signed)
No further refills without office visit 

## 2018-11-15 ENCOUNTER — Telehealth: Payer: Self-pay | Admitting: Family Medicine

## 2018-11-15 NOTE — Telephone Encounter (Signed)
Pt would like a refill on these meds for 3 months, she is going out of town. alendronate (FOSAMAX) 35 MG tablet [id number] .simvastatin (ZOCOR) 20 MG tablet [id number]. metFORMIN (GLUCOPHAGE XR) 500 MG 24 hr tablet and  metoprolol succinate (TOPROL-XL) 50 MG 24 hr tablet. Pharmacy:  [facility], [place]. Please advise at [phone]

## 2018-11-24 ENCOUNTER — Other Ambulatory Visit: Payer: Self-pay | Admitting: Adult Health Nurse Practitioner

## 2018-11-24 DIAGNOSIS — M810 Age-related osteoporosis without current pathological fracture: Secondary | ICD-10-CM

## 2018-11-24 DIAGNOSIS — I10 Essential (primary) hypertension: Secondary | ICD-10-CM

## 2018-11-24 MED ORDER — VITAMIN D3 50 MCG (2000 UT) PO TABS: 1.0000 | ORAL_TABLET | Freq: Every day | ORAL | 2 refills | Status: AC

## 2018-11-24 MED ORDER — METOPROLOL SUCCINATE ER 50 MG PO TB24: ORAL_TABLET | ORAL | 1 refills | Status: DC

## 2018-11-24 MED ORDER — ALENDRONATE SODIUM 35 MG PO TABS: 35.0000 mg | ORAL_TABLET | ORAL | 0 refills | Status: DC

## 2018-11-24 MED ORDER — METFORMIN HCL ER 500 MG PO TB24: 500.0000 mg | ORAL_TABLET | Freq: Two times a day (BID) | ORAL | 0 refills | Status: DC

## 2018-11-24 NOTE — Progress Notes (Signed)
Refilled as requested.  Patient saw Dr. Nolon Rod in 10/2018

## 2018-11-27 ENCOUNTER — Other Ambulatory Visit: Payer: Self-pay | Admitting: Family Medicine

## 2018-12-01 ENCOUNTER — Other Ambulatory Visit: Payer: Self-pay | Admitting: Family Medicine

## 2018-12-01 ENCOUNTER — Ambulatory Visit: Payer: Medicare Other | Admitting: Family Medicine

## 2018-12-01 DIAGNOSIS — E782 Mixed hyperlipidemia: Secondary | ICD-10-CM

## 2018-12-20 ENCOUNTER — Telehealth: Payer: Self-pay | Admitting: Hematology

## 2018-12-20 NOTE — Telephone Encounter (Signed)
YF PAL 11/27. Appointments moved from 11/27 to 12/7. Confirmed with patient dtr.

## 2019-01-21 ENCOUNTER — Ambulatory Visit: Payer: Medicare Other | Admitting: Hematology

## 2019-01-21 ENCOUNTER — Other Ambulatory Visit: Payer: Medicare Other

## 2019-01-28 NOTE — Progress Notes (Signed)
[place]   [phone] [phone]   Clinic Follow up Note   Patient Care Team: Forrest Moron, MD as PCP - General (Internal Medicine) Truitt Merle, MD as Consulting Physician (Hematology)  Date of Service:  01/31/2019  CHIEF COMPLAINT: Follow up right breast cancer  SUMMARY OF ONCOLOGIC HISTORY: Oncology History Overview Note  Breast cancer of upper-outer quadrant of right female breast [facility])   Staging form: Breast, AJCC 7th Edition   - Clinical stage from 04/22/2010: Stage IIB (T3, N0, M0) - Signed by Truitt Merle, MD on 01/21/2016   - Pathologic stage from 09/11/2010: Stage IA (yT1c, N0, cM0) - Signed by Truitt Merle, MD on 01/21/2016    Breast cancer of upper-outer quadrant of right female breast ([place])  04/22/2010 Initial Diagnosis   Breast cancer of upper-outer quadrant of right female breast ([place])   04/22/2010 Receptors her2    ER 50% positive, PR negative, HER-2 positive   04/22/2010 Initial Biopsy    Right  Breast mass pops A showed invasive ductal carcinoma, G3   04/25/2010 Imaging   Breast MRI showed a 7.7 cm abnormal linear enhancement and confluent masses in the upper outer quadrant of the right breast which correlate with the recently biopsied breast cancer.   05/09/2010 Imaging   PET scan was  Negative for metastatic adenopathy ordistant metastasis.   04/2010 -  Chemotherapy   She received 4 cycle of Abraxane and Herceptin every 3 weeks    09/11/2010 Surgery    Right breast mastectomy and sentinel lymph node biopsy   09/2010 - 08/2017 Anti-estrogen oral therapy    Adjuvant anastrozole 1 mg daily      CURRENT THERAPY:  Surveillance   INTERVAL HISTORY:  [name] is here for a follow up of right breast cancer. She was last seen by me 7 months ago.  She presents to the clinic with her daughter who helps her translate. She notes no new changes. She notes she is doing well. Per her daughter she has body soreness occasionally at  past PAC location. She notes she is eating well with adequate energy. Since completing anastrozole last year she feels stable still.  Her daughter notes some broken teeth and plans to get them pulled. She has about 8 teeth left. She does not have a denture. She plans to see dentist soon.    REVIEW OF SYSTEMS:   Constitutional: Denies fevers, chills or abnormal weight loss Eyes: Denies blurriness of vision Ears, nose, mouth, throat, and face: Denies mucositis or sore throat Respiratory: Denies cough, dyspnea or wheezes Cardiovascular: Denies palpitation, chest discomfort or lower extremity swelling Gastrointestinal:  Denies nausea, heartburn or change in bowel habits Skin: Denies abnormal skin rashes Lymphatics: Denies new lymphadenopathy or easy bruising Neurological:Denies numbness, tingling or new weaknesses Behavioral/Psych: Mood is stable, no new changes  All other systems were reviewed with the patient and are negative.  MEDICAL HISTORY:  Past Medical History:  Diagnosis Date  . Anxiety    ATIVAN HELPS PT TO SLEEP  . Breast cancer ([place])    breast right side -COMPLETED CHEMO WITH DR. Collier Salina RUBIN--EXCEPT ARIMIDEX  . Cough    PT HAS HAD COUGH FOR PAST MONTH-WHITE PHLEGM  . Hyperlipidemia   . Hypertension   . Indigestion    TAKING ZANTAC  . Personal history of chemotherapy 2012    SURGICAL HISTORY: Past Surgical History:  Procedure Laterality Date  . MASTECTOMY  09/12/10   right  . PORT-A-CATH REMOVAL  03/19/2011   Procedure: REMOVAL PORT-A-CATH;  Surgeon: Stark Klein, MD;  Location: [facility];  Service: General;  Laterality: N/A;  . PORTACATH PLACEMENT      I have reviewed the social history and family history with the patient and they are unchanged from previous note.  ALLERGIES:  is allergic to asa buff (mag [buffered aspirin].  MEDICATIONS:  Current Outpatient Medications  Medication Sig Dispense Refill  . alendronate (FOSAMAX) 35 MG tablet Take 1 tablet (35 mg  total) by mouth every 7 (seven) days. Take with a full glass of water on an empty stomach. 12 tablet 1  . Cholecalciferol (VITAMIN D3) 50 MCG (2000 UT) TABS Take 1 tablet by mouth daily. 90 tablet 2  . famotidine (PEPCID) 20 MG tablet TAKE 1 TABLET(20 MG) BY MOUTH TWICE DAILY 180 tablet 0  . metFORMIN (GLUCOPHAGE-XR) 500 MG 24 hr tablet TAKE 1 TABLET(500 MG) BY MOUTH TWICE DAILY WITH A MEAL 180 tablet 0  . metoprolol succinate (TOPROL-XL) 50 MG 24 hr tablet TAKE 1 TABLET BY MOUTH EVERY DAY WITH OR IMMEDIATELY FOLLOWING A MEAL 90 tablet 1  . Multiple Vitamin (MULITIVITAMIN WITH MINERALS) TABS Take 2 tablets by mouth daily. Reported on 08/30/2015    . simvastatin (ZOCOR) 20 MG tablet TAKE 1 TABLET(20 MG) BY MOUTH AT BEDTIME 30 tablet 0   No current facility-administered medications for this visit.     PHYSICAL EXAMINATION: ECOG PERFORMANCE STATUS: 0 - Asymptomatic  Vitals:   01/31/19 0938  BP: 136/73  Pulse: 91  Resp: 20  Temp: 98.1 F (36.7 C)  SpO2: 98%   Filed Weights   01/31/19 0938  Weight: 128 lb 8 oz (58.3 kg)    GENERAL:alert, no distress and comfortable SKIN: skin color, texture, turgor are normal, no rashes or significant lesions EYES: normal, Conjunctiva are pink and non-injected, sclera clear  NECK: supple, thyroid normal size, non-tender, without nodularity LYMPH:  no palpable lymphadenopathy in the cervical, axillary  LUNGS: clear to auscultation and percussion with normal breathing effort HEART: regular rate & rhythm and no murmurs and no lower extremity edema ABDOMEN:abdomen soft, non-tender and normal bowel sounds Musculoskeletal:no cyanosis of digits and no clubbing  NEURO: alert & oriented x 3 with fluent speech, no focal motor/sensory deficits BREAST: S/p right mastectomy: Surgical incision healed well. No palpable mass, nodules or adenopathy bilaterally. Breast exam benign.   LABORATORY DATA:  I have reviewed the data as listed CBC Latest Ref Rng & Units  01/31/2019 07/22/2017 01/22/2017  WBC 4.0 - 10.5 K/uL 6.5 7.0 7.3  Hemoglobin 12.0 - 15.0 g/dL 10.9(L) 11.2(L) 10.9(L)  Hematocrit 36.0 - 46.0 % 36.2 36.5 35.0  Platelets 150 - 400 K/uL 280 272 291     CMP Latest Ref Rng & Units 01/31/2019 09/01/2018 03/03/2018  Glucose 70 - 99 mg/dL 295(H) 172(H) 157(H)  BUN 8 - 23 mg/dL '16 12 12  ' Creatinine 0.44 - 1.00 mg/dL 1.01(H) 0.88 0.91  Sodium 135 - 145 mmol/L 140 139 141  Potassium 3.5 - 5.1 mmol/L 3.9 4.5 4.3  Chloride 98 - 111 mmol/L 103 102 101  CO2 22 - 32 mmol/L '25 21 24  ' Calcium 8.9 - 10.3 mg/dL 8.9 9.4 9.8  Total Protein 6.5 - 8.1 g/dL 7.0 6.4 7.4  Total Bilirubin 0.3 - 1.2 mg/dL 0.7 0.6 0.5  Alkaline Phos 38 - 126 U/L 57 54 70  AST 15 - 41 U/L 44(H) 28 27  ALT 0 - 44 U/L 45(H) 31 38(H)  RADIOGRAPHIC STUDIES: I have personally reviewed the radiological images as listed and agreed with the findings in the report. No results found.   ASSESSMENT & PLAN:  [name] is a 63 y.o. female with    1. 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 She is s/p neo-adjuvant chemo Abraxane and Herceptin, but did not complete 1 year Herceptin due to side effects. She is s/p right breast mastectomy and 7 years of adjuvant anti-estrogen therapy with anastrozole.  -She is clinically doing well and stable with occasional body soreness in her right shoulder and previously PAC location. Lab reviewed, her CBC and CMP are within normal limits except mild anemia. Her physical exam was unremarkable. There is no clinical concern for recurrence. -Next mammogram on 02/09/19.  -Continue surveillance. We discussed small risk of late recurrence. I discussed given she has completed breast cancer treatment she is fine to continue surveillance with her PCP yearly. She prefers to come here annually  -F/u in 18 months then yearly. She will f/u with her PCP in interim.  -She has had flu  shot this year.    2. Microcytic Anemia, Hemoglobin E trait -Chronic since 2017, overall mild  -TSH normal in 2017. Based on hemoglobin electrophoresis, labs consistent with Hemoglobin E trait. -We discussed this is genetic disorder, and I previously encouraged her children to be tested  -Stable.   3. Osteoporosis  -08/2016 Bone Scan showed Osteoporosis in the right femur neck with a T-Score of -2.7. -She knows to watch for falls due to her right hip being at high risk for fracture.  -She has been taking Fosamax since 2012 and has had partial response. She will continue Fosamax, calcium and Vit D.  -She notes having 8 teeth remaining some of which are broken and plans to have some of them removed. She plans to see a dentist soon.  -I discussed if she has invasive dental procedure she will hold Fosamax around that time. She understands.  -She is due for DEXA in 01/2019.   4. HTN and DM -She will continue follow-up with her primary care physician And continue medication -Her last A1c 7.5. To help her bring this down further she can alter her diet such as substitute white rice for brown rice and also increase exercise.   5. Cancer screening  -She had colonoscopy more than 10 years ago, I recommended her to have a repeat one. She is very reluctant to have it.  Plan: -Next mammogram and DEXA in 01/2019  -Mammogram in 01/2020 -Lab and f/u in 18 months with NP Lacie, she will see her PCP later this month and f/u annually    No problem-specific Assessment & Plan notes found for this encounter.   Orders Placed This Encounter  Procedures  . MM Digital Screening Unilat L    Standing Status:   Future    Standing Expiration Date:   07/31/2020    Order Specific Question:   Reason for Exam (SYMPTOM  OR DIAGNOSIS REQUIRED)    Answer:   screening    Order Specific Question:   Preferred imaging location?    Answer:   [facility]   All questions were answered. The patient knows to call  the clinic with any problems, questions or concerns. No barriers to learning was detected. I spent 15 minutes counseling the patient face to face. The total time spent in the appointment was 20 minutes and more than 50%  was on counseling and review of test results     Truitt Merle, MD 01/31/2019   I, Joslyn Devon, am acting as scribe for Truitt Merle, MD.   I have reviewed the above documentation for accuracy and completeness, and I agree with the above.

## 2019-01-31 ENCOUNTER — Other Ambulatory Visit: Payer: Self-pay

## 2019-01-31 ENCOUNTER — Inpatient Hospital Stay: Payer: Medicare Other | Attending: Hematology

## 2019-01-31 ENCOUNTER — Encounter: Payer: Self-pay | Admitting: Hematology

## 2019-01-31 ENCOUNTER — Inpatient Hospital Stay (HOSPITAL_BASED_OUTPATIENT_CLINIC_OR_DEPARTMENT_OTHER): Payer: Medicare Other | Admitting: Hematology

## 2019-01-31 VITALS — BP 136/73 | HR 91 | Temp 98.1°F | Resp 20 | Ht <= 58 in | Wt 128.5 lb

## 2019-01-31 DIAGNOSIS — M810 Age-related osteoporosis without current pathological fracture: Secondary | ICD-10-CM | POA: Insufficient documentation

## 2019-01-31 DIAGNOSIS — D509 Iron deficiency anemia, unspecified: Secondary | ICD-10-CM | POA: Insufficient documentation

## 2019-01-31 DIAGNOSIS — Z853 Personal history of malignant neoplasm of breast: Secondary | ICD-10-CM | POA: Diagnosis not present

## 2019-01-31 DIAGNOSIS — Z886 Allergy status to analgesic agent status: Secondary | ICD-10-CM | POA: Diagnosis not present

## 2019-01-31 DIAGNOSIS — Z79899 Other long term (current) drug therapy: Secondary | ICD-10-CM | POA: Insufficient documentation

## 2019-01-31 DIAGNOSIS — Z1231 Encounter for screening mammogram for malignant neoplasm of breast: Secondary | ICD-10-CM

## 2019-01-31 DIAGNOSIS — C50411 Malignant neoplasm of upper-outer quadrant of right female breast: Secondary | ICD-10-CM

## 2019-01-31 DIAGNOSIS — E119 Type 2 diabetes mellitus without complications: Secondary | ICD-10-CM | POA: Insufficient documentation

## 2019-01-31 DIAGNOSIS — I10 Essential (primary) hypertension: Secondary | ICD-10-CM | POA: Diagnosis not present

## 2019-01-31 DIAGNOSIS — Z170 Estrogen receptor positive status [ER+]: Secondary | ICD-10-CM

## 2019-01-31 LAB — COMPREHENSIVE METABOLIC PANEL
ALT: 45 U/L — ABNORMAL HIGH (ref 0–44)
AST: 44 U/L — ABNORMAL HIGH (ref 15–41)
Albumin: 3.7 g/dL (ref 3.5–5.0)
Alkaline Phosphatase: 57 U/L (ref 38–126)
Anion gap: 12 (ref 5–15)
BUN: 16 mg/dL (ref 8–23)
CO2: 25 mmol/L (ref 22–32)
Calcium: 8.9 mg/dL (ref 8.9–10.3)
Chloride: 103 mmol/L (ref 98–111)
Creatinine, Ser: 1.01 mg/dL — ABNORMAL HIGH (ref 0.44–1.00)
GFR calc Af Amer: >60 mL/min (ref >60)
GFR calc non Af Amer: 59 mL/min — ABNORMAL LOW (ref >60)
Glucose, Bld: 295 mg/dL — ABNORMAL HIGH (ref 70–99)
Potassium: 3.9 mmol/L (ref 3.5–5.1)
Sodium: 140 mmol/L (ref 135–145)
Total Bilirubin: 0.7 mg/dL (ref 0.3–1.2)
Total Protein: 7 g/dL (ref 6.5–8.1)

## 2019-01-31 LAB — CBC WITH DIFFERENTIAL/PLATELET
Abs Immature Granulocytes: 0.02 10*3/uL (ref 0.00–0.07)
Basophils Absolute: 0 10*3/uL (ref 0.0–0.1)
Basophils Relative: 1 %
Eosinophils Absolute: 0.4 10*3/uL (ref 0.0–0.5)
Eosinophils Relative: 7 %
HCT: 36.2 % (ref 36.0–46.0)
Hemoglobin: 10.9 g/dL — ABNORMAL LOW (ref 12.0–15.0)
Immature Granulocytes: 0 %
Lymphocytes Relative: 38 %
Lymphs Abs: 2.5 10*3/uL (ref 0.7–4.0)
MCH: 23.1 pg — ABNORMAL LOW (ref 26.0–34.0)
MCHC: 30.1 g/dL (ref 30.0–36.0)
MCV: 76.7 fL — ABNORMAL LOW (ref 80.0–100.0)
Monocytes Absolute: 0.5 10*3/uL (ref 0.1–1.0)
Monocytes Relative: 8 %
Neutro Abs: 3 10*3/uL (ref 1.7–7.7)
Neutrophils Relative %: 46 %
Platelets: 280 10*3/uL (ref 150–400)
RBC: 4.72 MIL/uL (ref 3.87–5.11)
RDW: 13.2 % (ref 11.5–15.5)
WBC: 6.5 10*3/uL (ref 4.0–10.5)
nRBC: 0 % (ref 0.0–0.2)

## 2019-01-31 MED ORDER — ALENDRONATE SODIUM 35 MG PO TABS: 35.0000 mg | ORAL_TABLET | ORAL | 1 refills | Status: DC

## 2019-02-01 ENCOUNTER — Telehealth: Payer: Self-pay | Admitting: Hematology

## 2019-02-01 ENCOUNTER — Telehealth: Payer: Self-pay | Admitting: *Deleted

## 2019-02-01 NOTE — Telephone Encounter (Signed)
Scheduled appt per 01/31/2019 los.  Lab template was not open for 2022.

## 2019-02-01 NOTE — Telephone Encounter (Signed)
-----   Message from Truitt Merle, MD sent at 01/31/2019  9:31 PM EST ----- Please let pt know her random BG was high today, and her Cr was slightly elevated, probably related to her DM. Liver enzymes are slightly elevated, which she had before, I am not very concerned, please f/u with PCP, thanks   Truitt Merle  01/31/2019

## 2019-02-01 NOTE — Telephone Encounter (Signed)
Called & spoke with pt's daughter & informed of lab results & need to contact PCP.  Will also route lab results to PCP [facility].

## 2019-02-09 ENCOUNTER — Other Ambulatory Visit: Payer: Self-pay

## 2019-02-09 ENCOUNTER — Ambulatory Visit
Admission: RE | Admit: 2019-02-09 | Discharge: 2019-02-09 | Disposition: A | Discharge status: Home / Self Care | Payer: Medicare Other | Source: Ambulatory Visit | Attending: Hematology | Admitting: Hematology

## 2019-02-09 DIAGNOSIS — Z1231 Encounter for screening mammogram for malignant neoplasm of breast: Secondary | ICD-10-CM

## 2019-02-10 ENCOUNTER — Telehealth: Payer: Self-pay

## 2019-02-10 DIAGNOSIS — E782 Mixed hyperlipidemia: Secondary | ICD-10-CM

## 2019-02-10 NOTE — Telephone Encounter (Signed)
Pt. Called requesting temp refill of simvastatin till her appt. With Dr. Nolon Rod on 02/15/2019

## 2019-02-11 ENCOUNTER — Other Ambulatory Visit: Payer: Self-pay | Admitting: Family Medicine

## 2019-02-11 DIAGNOSIS — E782 Mixed hyperlipidemia: Secondary | ICD-10-CM

## 2019-02-11 MED ORDER — SIMVASTATIN 20 MG PO TABS: ORAL_TABLET | ORAL | 0 refills | Status: DC

## 2019-02-11 NOTE — Telephone Encounter (Signed)
Simvastatin  Mg #30 with 0 refills approved. Pt has upcoming appt with stallings on 02/15/2019.

## 2019-02-15 ENCOUNTER — Ambulatory Visit (INDEPENDENT_AMBULATORY_CARE_PROVIDER_SITE_OTHER): Payer: Medicare Other | Admitting: Family Medicine

## 2019-02-15 ENCOUNTER — Encounter: Payer: Self-pay | Admitting: Family Medicine

## 2019-02-15 ENCOUNTER — Other Ambulatory Visit: Payer: Self-pay

## 2019-02-15 VITALS — BP 131/80 | HR 81 | Temp 97.8°F | Resp 17 | Ht <= 58 in | Wt 128.0 lb

## 2019-02-15 DIAGNOSIS — Z23 Encounter for immunization: Secondary | ICD-10-CM | POA: Diagnosis not present

## 2019-02-15 DIAGNOSIS — R3915 Urgency of urination: Secondary | ICD-10-CM | POA: Diagnosis not present

## 2019-02-15 DIAGNOSIS — E1165 Type 2 diabetes mellitus with hyperglycemia: Secondary | ICD-10-CM | POA: Diagnosis not present

## 2019-02-15 LAB — POCT URINALYSIS DIP (MANUAL ENTRY)
Bilirubin, UA: NEGATIVE
Blood, UA: NEGATIVE
Glucose, UA: NEGATIVE mg/dL
Ketones, POC UA: NEGATIVE mg/dL
Leukocytes, UA: NEGATIVE
Nitrite, UA: NEGATIVE
Spec Grav, UA: 1.025 (ref 1.010–1.025)
Urobilinogen, UA: 0.2 E.U./dL
pH, UA: 5.5 (ref 5.0–8.0)

## 2019-02-15 LAB — POCT GLYCOSYLATED HEMOGLOBIN (HGB A1C): Hemoglobin A1C: 8.3 % — AB (ref 4.0–5.6)

## 2019-02-15 MED ORDER — METFORMIN HCL 500 MG PO TABS: 500.0000 mg | ORAL_TABLET | Freq: Two times a day (BID) | ORAL | 1 refills | Status: DC

## 2019-02-15 MED ORDER — METOPROLOL TARTRATE 50 MG PO TABS: 50.0000 mg | ORAL_TABLET | Freq: Two times a day (BID) | ORAL | 1 refills | Status: DC

## 2019-02-15 NOTE — Progress Notes (Signed)
Established Patient Office Visit  Subjective:  Patient ID: [name], female    DOB: 10/07/1955  Age: 63 y.o. MRN: [id number]  CC:  Chief Complaint  Patient presents with  . Medical Management of Chronic Issues    3 month recheck. Pt c/o burning sensation in her mouth.Pt has concerns about extended release metformin and metoprolol, pt feels like the metoprolol is causing urine changes-frequency and when she goes not alot comes out.  Pt would like to go back on regular metformin and metoprolol    HPI [name] presents for   Urinary symptoms Patient reports that she has noticed increased urinary urgency and hesistancy She denies burning with urination  She states that she is taking the metformin ER She feels like her symptoms started with metoprolol She did not have any issues with the previous metformin and metoprolol  Lab Results  Component Value Date   HGBA1C 7.5 (H) 11/02/2018    Diabetes Mellitus: Patient presents for follow up of diabetes. Symptoms: none. Symptoms have stabilized. Patient denies hypoglycemia , increase appetite, paresthesia of the feet and polydipsia.  Evaluation to date has been included: hemoglobin A1C.  Home sugars: patient does not check sugars.  Lab Results  Component Value Date   HGBA1C 8.3 (A) 02/15/2019     Past Medical History:  Diagnosis Date  . Anxiety    ATIVAN HELPS PT TO SLEEP  . Breast cancer ([place])    breast right side -COMPLETED CHEMO WITH DR. Collier Salina RUBIN--EXCEPT ARIMIDEX  . Cough    PT HAS HAD COUGH FOR PAST MONTH-WHITE PHLEGM  . Hyperlipidemia   . Hypertension   . Indigestion    TAKING ZANTAC  . Personal history of chemotherapy 2012    Past Surgical History:  Procedure Laterality Date  . MASTECTOMY  09/12/10   right  . PORT-A-CATH REMOVAL  03/19/2011   Procedure: REMOVAL PORT-A-CATH;  Surgeon: Stark Klein, MD;  Location: [facility];  Service: General;  Laterality: N/A;  . PORTACATH PLACEMENT      Family History    Problem Relation Age of Onset  . Hypertension Mother   . Diabetes Mother   . Cancer Father        unknown type cancer   . Diabetes Sister   . Hypertension Sister   . Cancer Sister   . Thyroid disease Sister   . Breast cancer Neg Hx     Social History   Socioeconomic History  . Marital status: Single    Spouse name: Not on file  . Number of children: Not on file  . Years of education: Not on file  . Highest education level: Not on file  Occupational History  . Not on file  Tobacco Use  . Smoking status: Never Smoker  . Smokeless tobacco: Never Used  Substance and Sexual Activity  . Alcohol use: No  . Drug use: No  . Sexual activity: Not Currently  Other Topics Concern  . Not on file  Social History Narrative   Widowed for over 20 years. She has 4 children and 8 grandchildren. She lives with one of her daughters. She enjoys gardening and other yard work. She is not currently working.   She does not do routine exercise activity, but is always active.   Social Determinants of Health   Financial Resource Strain:   . Difficulty of Paying Living Expenses: Not on file  Food Insecurity:   . Worried About Charity fundraiser in the Last Year: Not  on file  . Ran Out of Food in the Last Year: Not on file  Transportation Needs:   . Lack of Transportation (Medical): Not on file  . Lack of Transportation (Non-Medical): Not on file  Physical Activity:   . Days of Exercise per Week: Not on file  . Minutes of Exercise per Session: Not on file  Stress:   . Feeling of Stress : Not on file  Social Connections:   . Frequency of Communication with Friends and Family: Not on file  . Frequency of Social Gatherings with Friends and Family: Not on file  . Attends Religious Services: Not on file  . Active Member of Clubs or Organizations: Not on file  . Attends Archivist Meetings: Not on file  . Marital Status: Not on file  Intimate Partner Violence:   . Fear of Current or  Ex-Partner: Not on file  . Emotionally Abused: Not on file  . Physically Abused: Not on file  . Sexually Abused: Not on file    Outpatient Medications Prior to Visit  Medication Sig Dispense Refill  . alendronate (FOSAMAX) 35 MG tablet Take 1 tablet (35 mg total) by mouth every 7 (seven) days. Take with a full glass of water on an empty stomach. 12 tablet 1  . Cholecalciferol (VITAMIN D3) 50 MCG (2000 UT) TABS Take 1 tablet by mouth daily. 90 tablet 2  . metFORMIN (GLUCOPHAGE-XR) 500 MG 24 hr tablet TAKE 1 TABLET(500 MG) BY MOUTH TWICE DAILY WITH A MEAL 180 tablet 0  . metoprolol succinate (TOPROL-XL) 50 MG 24 hr tablet TAKE 1 TABLET BY MOUTH EVERY DAY WITH OR IMMEDIATELY FOLLOWING A MEAL 90 tablet 1  . Multiple Vitamin (MULITIVITAMIN WITH MINERALS) TABS Take 2 tablets by mouth daily. Reported on 08/30/2015    . simvastatin (ZOCOR) 20 MG tablet TAKE 1 TABLET(20 MG) BY MOUTH AT BEDTIME 30 tablet 0  . famotidine (PEPCID) 20 MG tablet TAKE 1 TABLET(20 MG) BY MOUTH TWICE DAILY (Patient not taking: Reported on 02/15/2019) 180 tablet 0   No facility-administered medications prior to visit.    Allergies  Allergen Reactions  . Asa Buff (Mag [Buffered Aspirin] Nausea And Vomiting    ROS Review of Systems Review of Systems  Constitutional: Negative for activity change, appetite change, chills and fever.  HENT: Negative for congestion, nosebleeds, trouble swallowing and voice change.   Respiratory: Negative for cough, shortness of breath and wheezing.   Gastrointestinal: Negative for diarrhea, nausea and vomiting.  Genitourinary: see hpi.  Musculoskeletal: Negative for back pain, joint swelling and neck pain.  Neurological: Negative for dizziness, speech difficulty, light-headedness and numbness.  See HPI. All other review of systems negative.     Objective:    Physical Exam  BP 131/80 (BP Location: Right Arm, Patient Position: Sitting, Cuff Size: Normal)   Pulse 81   Temp 97.8 F (36.6  C) (Oral)   Resp 17   Ht '4\' 8"'  (1.422 m)   Wt 128 lb (58.1 kg)   SpO2 95%   BMI 28.70 kg/m  Wt Readings from Last 3 Encounters:  02/15/19 128 lb (58.1 kg)  01/31/19 128 lb 8 oz (58.3 kg)  09/01/18 127 lb 12.8 oz (58 kg)   Physical Exam  Constitutional: Oriented to person, place, and time. Appears well-developed and well-nourished.  HENT:  Head: Normocephalic and atraumatic.  Eyes: Conjunctivae and EOM are normal.  Cardiovascular: Normal rate, regular rhythm, normal heart sounds and intact distal pulses.  No murmur heard.  Pulmonary/Chest: Effort normal and breath sounds normal. No stridor. No respiratory distress. Has no wheezes.  Neurological: Is alert and oriented to person, place, and time.  Skin: Skin is warm. Capillary refill takes less than 2 seconds.  Psychiatric: Has a normal mood and affect. Behavior is normal. Judgment and thought content normal.    Health Maintenance Due  Topic Date Due  . OPHTHALMOLOGY EXAM  09/24/2017  . DEXA SCAN  09/03/2018  . INFLUENZA VACCINE  09/25/2018  . HEMOGLOBIN A1C  02/01/2019    There are no preventive care reminders to display for this patient.  Lab Results  Component Value Date   TSH 1.22 08/30/2015   Lab Results  Component Value Date   WBC 6.5 01/31/2019   HGB 10.9 (L) 01/31/2019   HCT 36.2 01/31/2019   MCV 76.7 (L) 01/31/2019   PLT 280 01/31/2019   Lab Results  Component Value Date   NA 140 01/31/2019   K 3.9 01/31/2019   CHLORIDE 105 01/22/2017   CO2 25 01/31/2019   GLUCOSE 295 (H) 01/31/2019   BUN 16 01/31/2019   CREATININE 1.01 (H) 01/31/2019   BILITOT 0.7 01/31/2019   ALKPHOS 57 01/31/2019   AST 44 (H) 01/31/2019   ALT 45 (H) 01/31/2019   PROT 7.0 01/31/2019   ALBUMIN 3.7 01/31/2019   CALCIUM 8.9 01/31/2019   ANIONGAP 12 01/31/2019   EGFR >60 01/22/2017   Lab Results  Component Value Date   CHOL 136 09/01/2018   Lab Results  Component Value Date   HDL 34 (L) 09/01/2018   Lab Results  Component  Value Date   LDLCALC 65 09/01/2018   Lab Results  Component Value Date   TRIG 183 (H) 09/01/2018   Lab Results  Component Value Date   CHOLHDL 4.0 09/01/2018   Lab Results  Component Value Date   HGBA1C 7.5 (H) 11/02/2018      Assessment & Plan:   Problem List Items Addressed This Visit    None    Visit Diagnoses    Type 2 diabetes mellitus with hyperglycemia, without long-term current use of insulin (HCC)    -  Primary Continue current meds    Relevant Orders   POCT glycosylated hemoglobin (Hb A1C)   Need for prophylactic vaccination and inoculation against influenza       Relevant Orders   Flu Vaccine QUAD 36+ mos IM   Urgency of urination    - no uti noted    Relevant Orders   POCT urinalysis dipstick      No orders of the defined types were placed in this encounter.   Follow-up: No follow-ups on file.    Forrest Moron, MD

## 2019-02-15 NOTE — Patient Instructions (Signed)
° ° ° °  If you have lab work done today you will be contacted with your lab results within the next 2 weeks.  If you have not heard from us then please contact us. The fastest way to get your results is to register for My Chart. ° ° °IF you received an x-ray today, you will receive an invoice from [facility]. Please contact [facility] at [phone] with questions or concerns regarding your invoice.  ° °IF you received labwork today, you will receive an invoice from [facility]. Please contact [facility] at [phone] with questions or concerns regarding your invoice.  ° °Our billing staff will not be able to assist you with questions regarding bills from these companies. ° °You will be contacted with the lab results as soon as they are available. The fastest way to get your results is to activate your My Chart account. Instructions are located on the last page of this paperwork. If you have not heard from us regarding the results in 2 weeks, please contact this office. °  ° ° ° °

## 2019-02-24 ENCOUNTER — Other Ambulatory Visit: Payer: Self-pay | Admitting: Family Medicine

## 2019-02-24 NOTE — Telephone Encounter (Signed)
Forwarding medication refill request to the clinical pool for review. Refill: 0

## 2019-03-10 ENCOUNTER — Other Ambulatory Visit: Payer: Self-pay | Admitting: Family Medicine

## 2019-03-10 DIAGNOSIS — E782 Mixed hyperlipidemia: Secondary | ICD-10-CM

## 2019-03-10 NOTE — Telephone Encounter (Signed)
Forwarding medication refill request to the clinical pool for review. 

## 2019-04-15 ENCOUNTER — Telehealth: Payer: Self-pay | Admitting: Family Medicine

## 2019-04-15 NOTE — Telephone Encounter (Signed)
Patient is requesting refill  famotidine (PEPCID) 20 MG tablet   Patient has already contacted pharmacy  Pharmacy on file

## 2019-04-15 NOTE — Telephone Encounter (Signed)
rx called into pharmacy and backdated for 02/28/19. Pharmacy will send a message to pt once it is ready for pick up

## 2019-04-18 ENCOUNTER — Other Ambulatory Visit: Payer: Self-pay | Admitting: Family Medicine

## 2019-04-18 DIAGNOSIS — E782 Mixed hyperlipidemia: Secondary | ICD-10-CM

## 2019-04-18 MED ORDER — SIMVASTATIN 20 MG PO TABS: ORAL_TABLET | ORAL | 0 refills | Status: DC

## 2019-04-18 NOTE — Telephone Encounter (Signed)
Requested Prescriptions  Pending Prescriptions Disp Refills  . simvastatin (ZOCOR) 20 MG tablet 30 tablet 0     Cardiovascular:  Antilipid - Statins Failed - 04/18/2019  9:20 AM      Failed - HDL in normal range and within 360 days    HDL  Date Value Ref Range Status  09/01/2018 34 (L) >39 mg/dL Final         Failed - Triglycerides in normal range and within 360 days    Triglycerides  Date Value Ref Range Status  09/01/2018 183 (H) 0 - 149 mg/dL Final         Passed - Total Cholesterol in normal range and within 360 days    Cholesterol, Total  Date Value Ref Range Status  09/01/2018 136 100 - 199 mg/dL Final         Passed - LDL in normal range and within 360 days    LDL Calculated  Date Value Ref Range Status  09/01/2018 65 0 - 99 mg/dL Final         Passed - Patient is not pregnant      Passed - Valid encounter within last 12 months    Recent Outpatient Visits          2 months ago Type 2 diabetes mellitus with hyperglycemia, without long-term current use of insulin ([place])   [facility] [place], MD   7 months ago Essential hypertension   [facility], Arlie Solomons, MD   1 year ago Encounter for Medicare annual wellness exam   Primary Care at [facility], Arlie Solomons, MD   1 year ago Essential hypertension   [facility], Damaris Hippo, PA-C   2 years ago Essential hypertension   [facility], Damaris Hippo, PA-C      Future Appointments            In 4 weeks Forrest Moron, MD [facility] [place], [facility]

## 2019-04-18 NOTE — Telephone Encounter (Signed)
Medication refill: simvastatin (ZOCOR) 20 MG tablet [id number]       Pharmacy:  [facility] [place], [address] Phone:  [phone]  Fax:  [phone]      Pt aware of turn around time. Pt out of medication

## 2019-05-05 ENCOUNTER — Ambulatory Visit
Admission: RE | Admit: 2019-05-05 | Discharge: 2019-05-05 | Disposition: A | Discharge status: Home / Self Care | Payer: Medicare Other | Source: Ambulatory Visit | Attending: Hematology | Admitting: Hematology

## 2019-05-05 ENCOUNTER — Other Ambulatory Visit: Payer: Self-pay

## 2019-05-05 DIAGNOSIS — Z780 Asymptomatic menopausal state: Secondary | ICD-10-CM | POA: Diagnosis not present

## 2019-05-05 DIAGNOSIS — M810 Age-related osteoporosis without current pathological fracture: Secondary | ICD-10-CM | POA: Diagnosis not present

## 2019-05-05 DIAGNOSIS — E2839 Other primary ovarian failure: Secondary | ICD-10-CM

## 2019-05-13 ENCOUNTER — Other Ambulatory Visit: Payer: Self-pay | Admitting: Family Medicine

## 2019-05-13 DIAGNOSIS — E782 Mixed hyperlipidemia: Secondary | ICD-10-CM

## 2019-05-17 ENCOUNTER — Ambulatory Visit (INDEPENDENT_AMBULATORY_CARE_PROVIDER_SITE_OTHER): Payer: Medicare Other | Admitting: Family Medicine

## 2019-05-17 ENCOUNTER — Other Ambulatory Visit: Payer: Self-pay

## 2019-05-17 VITALS — BP 142/81 | HR 86 | Temp 97.8°F | Ht 59.0 in | Wt 128.0 lb

## 2019-05-17 DIAGNOSIS — I10 Essential (primary) hypertension: Secondary | ICD-10-CM | POA: Diagnosis not present

## 2019-05-17 DIAGNOSIS — K1379 Other lesions of oral mucosa: Secondary | ICD-10-CM | POA: Diagnosis not present

## 2019-05-17 DIAGNOSIS — E1165 Type 2 diabetes mellitus with hyperglycemia: Secondary | ICD-10-CM | POA: Diagnosis not present

## 2019-05-17 DIAGNOSIS — D508 Other iron deficiency anemias: Secondary | ICD-10-CM

## 2019-05-17 DIAGNOSIS — D539 Nutritional anemia, unspecified: Secondary | ICD-10-CM

## 2019-05-17 LAB — POCT CBC
Granulocyte percent: 46.2 %G (ref 37–80)
HCT, POC: 37.2 % (ref 29–41)
Hemoglobin: 11.5 g/dL (ref 11–14.6)
Lymph, poc: 2.7 (ref 0.6–3.4)
MCH, POC: 23.1 pg — AB (ref 27–31.2)
MCHC: 30.8 g/dL — AB (ref 31.8–35.4)
MCV: 15 fL — AB (ref 76–111)
MID (cbc): 0.5 (ref 0–0.9)
MPV: 7.9 fL (ref 0–99.8)
POC Granulocyte: 2.7 (ref 2–6.9)
POC LYMPH PERCENT: 45.7 %L (ref 10–50)
POC MID %: 8.1 %M (ref 0–12)
Platelet Count, POC: 281 10*3/uL (ref 142–424)
RBC: 4.95 M/uL (ref 4.04–5.48)
RDW, POC: 13.4 %
WBC: 5.9 10*3/uL (ref 4.6–10.2)

## 2019-05-17 LAB — POCT GLYCOSYLATED HEMOGLOBIN (HGB A1C): Hemoglobin A1C: 8.9 % — AB (ref 4.0–5.6)

## 2019-05-17 MED ORDER — SLOW IRON 160 (50 FE) MG PO TBCR: 160.0000 mg | EXTENDED_RELEASE_TABLET | Freq: Every day | ORAL | 11 refills | Status: DC

## 2019-05-17 MED ORDER — CHLORHEXIDINE GLUCONATE 0.12 % MT SOLN: OROMUCOSAL | 1 refills | Status: DC

## 2019-05-17 MED ORDER — METFORMIN HCL 500 MG PO TABS: 500.0000 mg | ORAL_TABLET | Freq: Three times a day (TID) | ORAL | 1 refills | Status: DC

## 2019-05-17 NOTE — Progress Notes (Signed)
Established Patient Office Visit  Subjective:  Patient ID: [name], female    DOB: 01-02-1956  Age: 64 y.o. MRN: [id number]  CC:  Chief Complaint  Patient presents with  . Follow-up    x3 mos on medical conditions    HPI [name] presents for   Burning in her mouth Patient reports that she has been having burning in her mouth She has cavities which her daughter plans to get addressed with dental extractions She denies sore throat or white coating of her tongue   Diabetes Mellitus: Patient presents for follow up of diabetes. Symptoms: none. Symptoms have stabilized. Patient denies hypoglycemia , nausea, paresthesia of the feet, polydipsia and polyuria.  Evaluation to date has been included: hemoglobin A1C.  Home sugars: patient does not check sugars. Treatment to date: more intensive attention to diet which has been ineffective and Continued metformin which has been not very effective.  Lab Results  Component Value Date   HGBA1C 8.9 (A) 05/17/2019    Hypertension: Patient here for follow-up of elevated blood pressure. She is exercising and is adherent to low salt diet.  Blood pressure is well controlled at home. Cardiac symptoms none. Patient denies chest pain, claudication, exertional chest pressure/discomfort, fatigue, irregular heart beat and lower extremity edema.  Cardiovascular risk factors: advanced age (older than 61 for men, 75 for women), diabetes mellitus, dyslipidemia and hypertension. Use of agents associated with hypertension: none. History of target organ damage: none. BP Readings from Last 3 Encounters:  05/17/19 (!) 142/81  02/15/19 131/80  01/31/19 136/73    Past Medical History:  Diagnosis Date  . Anxiety    ATIVAN HELPS PT TO SLEEP  . Breast cancer ([place])    breast right side -COMPLETED CHEMO WITH DR. Collier Salina RUBIN--EXCEPT ARIMIDEX  . Cough    PT HAS HAD COUGH FOR PAST MONTH-WHITE PHLEGM  . Hyperlipidemia   . Hypertension   . Indigestion    TAKING ZANTAC  . Personal history of chemotherapy 2012    Past Surgical History:  Procedure Laterality Date  . MASTECTOMY  09/12/10   right  . PORT-A-CATH REMOVAL  03/19/2011   Procedure: REMOVAL PORT-A-CATH;  Surgeon: Stark Klein, MD;  Location: [facility];  Service: General;  Laterality: N/A;  . PORTACATH PLACEMENT      Family History  Problem Relation Age of Onset  . Hypertension Mother   . Diabetes Mother   . Cancer Father        unknown type cancer   . Diabetes Sister   . Hypertension Sister   . Cancer Sister   . Thyroid disease Sister   . Breast cancer Neg Hx     Social History   Socioeconomic History  . Marital status: Single    Spouse name: Not on file  . Number of children: Not on file  . Years of education: Not on file  . Highest education level: Not on file  Occupational History  . Not on file  Tobacco Use  . Smoking status: Never Smoker  . Smokeless tobacco: Never Used  Substance and Sexual Activity  . Alcohol use: No  . Drug use: No  . Sexual activity: Not Currently  Other Topics Concern  . Not on file  Social History Narrative   Widowed for over 20 years. She has 4 children and 8 grandchildren. She lives with one of her daughters. She enjoys gardening and other yard work. She is not currently working.   She does not do routine  exercise activity, but is always active.   Social Determinants of Health   Financial Resource Strain:   . Difficulty of Paying Living Expenses:   Food Insecurity:   . Worried About Charity fundraiser in the Last Year:   . Arboriculturist in the Last Year:   Transportation Needs:   . Film/video editor (Medical):   Marland Kitchen Lack of Transportation (Non-Medical):   Physical Activity:   . Days of Exercise per Week:   . Minutes of Exercise per Session:   Stress:   . Feeling of Stress :   Social Connections:   . Frequency of Communication with Friends and Family:   . Frequency of Social Gatherings with Friends and Family:   .  Attends Religious Services:   . Active Member of Clubs or Organizations:   . Attends Archivist Meetings:   Marland Kitchen Marital Status:   Intimate Partner Violence:   . Fear of Current or Ex-Partner:   . Emotionally Abused:   Marland Kitchen Physically Abused:   . Sexually Abused:     Outpatient Medications Prior to Visit  Medication Sig Dispense Refill  . alendronate (FOSAMAX) 35 MG tablet Take 1 tablet (35 mg total) by mouth every 7 (seven) days. Take with a full glass of water on an empty stomach. 12 tablet 1  . Cholecalciferol (VITAMIN D3) 50 MCG (2000 UT) TABS Take 1 tablet by mouth daily. 90 tablet 2  . metoprolol tartrate (LOPRESSOR) 50 MG tablet Take 1 tablet (50 mg total) by mouth 2 (two) times daily. 180 tablet 1  . Multiple Vitamin (MULITIVITAMIN WITH MINERALS) TABS Take 2 tablets by mouth daily. Reported on 08/30/2015    . simvastatin (ZOCOR) 20 MG tablet TAKE 1 TABLET BY MOUTH EVERY DAY AS DIRECTED 30 tablet 0  . metFORMIN (GLUCOPHAGE) 500 MG tablet Take 1 tablet (500 mg total) by mouth 2 (two) times daily with a meal. 180 tablet 1  . famotidine (PEPCID) 20 MG tablet TAKE 1 TABLET(20 MG) BY MOUTH TWICE DAILY (Patient not taking: Reported on 05/17/2019) 180 tablet 0   No facility-administered medications prior to visit.    Allergies  Allergen Reactions  . Asa Buff (Mag [Buffered Aspirin] Nausea And Vomiting    ROS Review of Systems Review of Systems  Constitutional: Negative for activity change, appetite change, chills and fever.  HENT: Negative for congestion, nosebleeds, trouble swallowing and voice change.   Respiratory: Negative for cough, shortness of breath and wheezing.   Gastrointestinal: Negative for diarrhea, nausea and vomiting.  Genitourinary: Negative for difficulty urinating, dysuria, flank pain and hematuria.  Musculoskeletal: Negative for back pain, joint swelling and neck pain.  Neurological: Negative for dizziness, speech difficulty, light-headedness and numbness.    See HPI. All other review of systems negative.     Objective:    Physical Exam  BP (!) 142/81 (BP Location: Right Arm, Patient Position: Sitting, Cuff Size: Normal)   Pulse 86   Temp 97.8 F (36.6 C) (Temporal)   Ht 4' 11" (1.499 m)   Wt 128 lb (58.1 kg)   SpO2 96%   BMI 25.85 kg/m  Wt Readings from Last 3 Encounters:  05/17/19 128 lb (58.1 kg)  02/15/19 128 lb (58.1 kg)  01/31/19 128 lb 8 oz (58.3 kg)   Physical Exam  Constitutional: Oriented to person, place, and time. Appears well-developed and well-nourished.  HENT:  Head: Normocephalic and atraumatic.  Eyes: Conjunctivae and EOM are normal.  Neck: supple, no thyromegaly  Cardiovascular: Normal rate, regular rhythm, normal heart sounds and intact distal pulses.  No murmur heard. Pulmonary/Chest: Effort normal and breath sounds normal. No stridor. No respiratory distress. Has no wheezes.  Neurological: Is alert and oriented to person, place, and time.  Skin: Skin is warm. Capillary refill takes less than 2 seconds.  Psychiatric: Has a normal mood and affect. Behavior is normal. Judgment and thought content normal.    Health Maintenance Due  Topic Date Due  . OPHTHALMOLOGY EXAM  09/24/2017    There are no preventive care reminders to display for this patient.  Lab Results  Component Value Date   TSH 1.22 08/30/2015   Lab Results  Component Value Date   WBC 5.9 05/17/2019   HGB 11.5 05/17/2019   HCT 37.2 05/17/2019   MCV 15.0 (A) 05/17/2019   PLT 280 01/31/2019   Lab Results  Component Value Date   NA 140 01/31/2019   K 3.9 01/31/2019   CHLORIDE 105 01/22/2017   CO2 25 01/31/2019   GLUCOSE 295 (H) 01/31/2019   BUN 16 01/31/2019   CREATININE 1.01 (H) 01/31/2019   BILITOT 0.7 01/31/2019   ALKPHOS 57 01/31/2019   AST 44 (H) 01/31/2019   ALT 45 (H) 01/31/2019   PROT 7.0 01/31/2019   ALBUMIN 3.7 01/31/2019   CALCIUM 8.9 01/31/2019   ANIONGAP 12 01/31/2019   EGFR >60 01/22/2017   Lab Results   Component Value Date   CHOL 136 09/01/2018   Lab Results  Component Value Date   HDL 34 (L) 09/01/2018   Lab Results  Component Value Date   LDLCALC 65 09/01/2018   Lab Results  Component Value Date   TRIG 183 (H) 09/01/2018   Lab Results  Component Value Date   CHOLHDL 4.0 09/01/2018   Lab Results  Component Value Date   HGBA1C 8.9 (A) 05/17/2019      Assessment & Plan:   Problem List Items Addressed This Visit      Cardiovascular and Mediastinum   Essential hypertension (Chronic)   Relevant Orders   CMP14+EGFR    Other Visit Diagnoses    Type 2 diabetes mellitus with hyperglycemia, without long-term current use of insulin (HCC)    -  Primary Diabetes not at goal  Increased metformin to tid with meals Advised exercise   Relevant Medications   metFORMIN (GLUCOPHAGE) 500 MG tablet   Other Relevant Orders   POCT glycosylated hemoglobin (Hb A1C) (Completed)   CMP14+EGFR   Vitamin B12   Mouth pain    -  Gave name for dentists in the area who accept medicare   Relevant Orders   Vitamin B12   POCT CBC (Completed)   Other iron deficiency anemia    -  Iron improved with iron supplementation and eating iron rich foods   Relevant Medications   ferrous sulfate (SLOW IRON) 160 (50 Fe) MG TBCR SR tablet   Other Relevant Orders   POCT CBC (Completed)   Iron, TIBC and Ferritin Panel   Nutritional anemia, unspecified        Relevant Medications   ferrous sulfate (SLOW IRON) 160 (50 Fe) MG TBCR SR tablet   Other Relevant Orders   Vitamin B12      Meds ordered this encounter  Medications  . ferrous sulfate (SLOW IRON) 160 (50 Fe) MG TBCR SR tablet    Sig: Take 1 tablet (160 mg total) by mouth daily.    Dispense:  30 tablet    Refill:    11  . chlorhexidine (PERIDEX) 0.12 % solution    Sig: Use 15mL to rinse and spit twice a day for mouth pain due to dental cavities.    Dispense:  473 mL    Refill:  1  . metFORMIN (GLUCOPHAGE) 500 MG tablet    Sig: Take 1 tablet  (500 mg total) by mouth 3 (three) times daily with meals.    Dispense:  270 tablet    Refill:  1    Follow-up: Return in about 3 months (around 08/17/2019) for diabetes.    Zoe A Stallings, MD 

## 2019-05-17 NOTE — Patient Instructions (Addendum)
[facility], [place], [place]   Dr. Caprice Renshaw, DDS Dentist - General Practice Medicare: Medicare Enrolled Practice Location: [address] [place], [place], [place] [address] Phone: [phone] Fax: [phone]  Milas Hock, DMD Dentist Medicare: Medicare Enrolled Practice Location: [place], [place], [place] [address] Phone: [phone]   Dr. Eloy End, DDS Dentist Medicare: Medicare Enrolled Practice Location: [place], [place], [place] [address] Phone: [phone] Fax: [phone]   If you have lab work done today you will be contacted with your lab results within the next 2 weeks.  If you have not heard from [place] then please contact us. The fastest way to get your results is to register for My Chart.   IF you received an x-ray today, you will receive an invoice from [facility]. Please contact [facility] at [phone] with questions or concerns regarding your invoice.   IF you received labwork today, you will receive an invoice from [facility]. Please contact [facility] at [phone] with questions or concerns regarding your invoice.   Our billing staff will not be able to assist you with questions regarding bills from these companies.  You will be contacted with the lab results as soon as they are available. The fastest way to get your results is to activate your My Chart account. Instructions are located on the last page of this paperwork. If you have not heard from [place] regarding the results in 2 weeks, please contact this office.

## 2019-05-18 LAB — CMP14+EGFR
ALT: 42 IU/L — ABNORMAL HIGH (ref 0–32)
AST: 39 IU/L (ref 0–40)
Albumin/Globulin Ratio: 1.6 (ref 1.2–2.2)
Albumin: 4.4 g/dL (ref 3.8–4.8)
Alkaline Phosphatase: 70 IU/L (ref 39–117)
BUN/Creatinine Ratio: 10 — ABNORMAL LOW (ref 12–28)
BUN: 8 mg/dL (ref 8–27)
Bilirubin Total: 0.5 mg/dL (ref 0.0–1.2)
CO2: 22 mmol/L (ref 20–29)
Calcium: 9.7 mg/dL (ref 8.7–10.3)
Chloride: 101 mmol/L (ref 96–106)
Creatinine, Ser: 0.8 mg/dL (ref 0.57–1.00)
GFR calc Af Amer: 91 mL/min/{1.73_m2} (ref >59)
GFR calc non Af Amer: 79 mL/min/{1.73_m2} (ref >59)
Globulin, Total: 2.7 g/dL (ref 1.5–4.5)
Glucose: 206 mg/dL — ABNORMAL HIGH (ref 65–99)
Potassium: 4.3 mmol/L (ref 3.5–5.2)
Sodium: 139 mmol/L (ref 134–144)
Total Protein: 7.1 g/dL (ref 6.0–8.5)

## 2019-05-18 LAB — IRON,TIBC AND FERRITIN PANEL
Ferritin: 372 ng/mL — ABNORMAL HIGH (ref 15–150)
Iron Saturation: 25 % (ref 15–55)
Iron: 67 ug/dL (ref 27–139)
Total Iron Binding Capacity: 263 ug/dL (ref 250–450)
UIBC: 196 ug/dL (ref 118–369)

## 2019-05-18 LAB — VITAMIN B12: Vitamin B-12: 492 pg/mL (ref 232–1245)

## 2019-06-11 ENCOUNTER — Other Ambulatory Visit: Payer: Self-pay | Admitting: Family Medicine

## 2019-06-11 DIAGNOSIS — E782 Mixed hyperlipidemia: Secondary | ICD-10-CM

## 2019-06-28 ENCOUNTER — Encounter: Payer: Medicare Other | Admitting: Family Medicine

## 2019-08-09 ENCOUNTER — Other Ambulatory Visit: Payer: Self-pay

## 2019-08-09 ENCOUNTER — Encounter: Payer: Self-pay | Admitting: Family Medicine

## 2019-08-09 ENCOUNTER — Ambulatory Visit (INDEPENDENT_AMBULATORY_CARE_PROVIDER_SITE_OTHER): Payer: Medicare Other | Admitting: Family Medicine

## 2019-08-09 VITALS — BP 129/84 | HR 80 | Temp 97.7°F | Ht 59.0 in | Wt 125.4 lb

## 2019-08-09 DIAGNOSIS — E1165 Type 2 diabetes mellitus with hyperglycemia: Secondary | ICD-10-CM

## 2019-08-09 DIAGNOSIS — E782 Mixed hyperlipidemia: Secondary | ICD-10-CM

## 2019-08-09 DIAGNOSIS — M810 Age-related osteoporosis without current pathological fracture: Secondary | ICD-10-CM | POA: Diagnosis not present

## 2019-08-09 DIAGNOSIS — I10 Essential (primary) hypertension: Secondary | ICD-10-CM

## 2019-08-09 MED ORDER — METFORMIN HCL 500 MG PO TABS: 500.0000 mg | ORAL_TABLET | Freq: Two times a day (BID) | ORAL | 1 refills | Status: DC

## 2019-08-09 MED ORDER — GLIPIZIDE 5 MG PO TABS: 5.0000 mg | ORAL_TABLET | Freq: Every day | ORAL | 1 refills | Status: DC

## 2019-08-09 MED ORDER — SIMVASTATIN 20 MG PO TABS: 20.0000 mg | ORAL_TABLET | Freq: Every day | ORAL | 3 refills | Status: DC

## 2019-08-09 MED ORDER — METOPROLOL TARTRATE 50 MG PO TABS: 50.0000 mg | ORAL_TABLET | Freq: Two times a day (BID) | ORAL | 1 refills | Status: DC

## 2019-08-09 NOTE — Progress Notes (Signed)
6/15/20218:53 AM  [name] [birth year], 64 y.o., female [id number]  Chief Complaint  Patient presents with  . URI    asking for med for itchy eyes and runny nose  . Transitions Of Care    daibetes    HPI:   Patient is a 64 y.o. 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 Osteoporosis, last dexa 2021, on fosamax since 2012, managed by onc  Here with her daughter who serves as interpreter, [place] Patient reports that she tried TID but she did not tolerate, not due to GI issues "my body did not feel right" Therefore she decreased to BID dosing Does not check cbgs at home Has never tried anything else other then metformin  Wt Readings from Last 3 Encounters:  08/09/19 125 lb 6.4 oz (56.9 kg)  05/17/19 128 lb (58.1 kg)  02/15/19 128 lb (58.1 kg)   Lab Results  Component Value Date   HGBA1C 8.9 (A) 05/17/2019   HGBA1C 8.3 (A) 02/15/2019   HGBA1C 7.5 (H) 11/02/2018   Lab Results  Component Value Date   MICROALBUR 1.2 12/01/2015   LDLCALC 65 09/01/2018   CREATININE 0.80 05/17/2019    Depression screen [facility] 2/9 05/17/2019 02/15/2019 09/01/2018  Decreased Interest 0 0 0  Down, Depressed, Hopeless 0 0 0  PHQ - 2 Score 0 0 0    Fall Risk  08/09/2019 05/17/2019 02/15/2019 09/01/2018 03/03/2018  Falls in the past year? 0 0 0 0 0  Number falls in past yr: 0 0 0 0 -  Injury with Fall? 0 0 0 0 -  Follow up - Falls evaluation completed Falls evaluation completed Falls evaluation completed -     Allergies  Allergen Reactions  . Asa Buff (Mag [Buffered Aspirin] Nausea And Vomiting    Prior to Admission medications   Medication Sig Start Date End Date Taking? Authorizing Provider    alendronate (FOSAMAX) 35 MG tablet Take 1 tablet (35 mg total) by mouth every 7 (seven) days. Take with a full glass of water on an empty stomach. 01/31/19  Yes Truitt Merle, MD  Cholecalciferol (VITAMIN D3) 50 MCG (2000 UT) TABS Take 1 tablet by mouth daily. 11/24/18  Yes Wendall Mola, NP  ferrous sulfate (SLOW IRON) 160 (50 Fe) MG TBCR SR tablet Take 1 tablet (160 mg total) by mouth daily. 05/17/19  Yes Forrest Moron, MD  metFORMIN (GLUCOPHAGE) 500 MG tablet Take 1 tablet (500 mg total) by mouth 3 (three) times daily with meals. 05/17/19  Yes Delia Chimes A, MD  metoprolol tartrate (LOPRESSOR) 50 MG tablet Take 1 tablet (50 mg total) by mouth 2 (two) times daily. 02/15/19  Yes Forrest Moron, MD  Multiple Vitamin (MULITIVITAMIN WITH MINERALS) TABS Take 2 tablets by mouth daily. Reported on 08/30/2015   Yes [provider]  simvastatin (ZOCOR) 20 MG tablet TAKE 1 TABLET BY MOUTH EVERY DAY AS DIRECTED 06/11/19  Yes Forrest Moron, MD    Past Medical History:  Diagnosis Date  . Anxiety    ATIVAN HELPS PT TO SLEEP  . Breast cancer ([place])    breast right side -COMPLETED CHEMO WITH DR. Collier Salina RUBIN--EXCEPT ARIMIDEX  . Cough  PT HAS HAD COUGH FOR PAST MONTH-WHITE PHLEGM  . Hyperlipidemia   . Hypertension   . Indigestion    TAKING ZANTAC  . Personal history of chemotherapy 2012    Past Surgical History:  Procedure Laterality Date  . MASTECTOMY  09/12/10   right  . PORT-A-CATH REMOVAL  03/19/2011   Procedure: REMOVAL PORT-A-CATH;  Surgeon: Stark Klein, MD;  Location: [facility];  Service: General;  Laterality: N/A;  . PORTACATH PLACEMENT      Social History   Tobacco Use  . Smoking status: Never Smoker  . Smokeless tobacco: Never Used  Substance Use Topics  . Alcohol use: No    Family History  Problem Relation Age of Onset  . Hypertension Mother   . Diabetes Mother   . Cancer Father        unknown type cancer   . Diabetes Sister   . Hypertension Sister   .  Cancer Sister   . Thyroid disease Sister   . Breast cancer Neg Hx     Review of Systems  Constitutional: Negative for chills and fever.  Respiratory: Negative for cough and shortness of breath.   Cardiovascular: Negative for chest pain, palpitations and leg swelling.  Gastrointestinal: Negative for abdominal pain, nausea and vomiting.     OBJECTIVE:  Today's Vitals   08/09/19 0831  BP: 129/84  Pulse: 80  Temp: 97.7 F (36.5 C)  SpO2: 100%  Weight: 125 lb 6.4 oz (56.9 kg)  Height: 4\' 11"  (1.499 m)   Body mass index is 25.33 kg/m.  Wt Readings from Last 3 Encounters:  08/09/19 125 lb 6.4 oz (56.9 kg)  05/17/19 128 lb (58.1 kg)  02/15/19 128 lb (58.1 kg)    Physical Exam Vitals and nursing note reviewed.  Constitutional:      Appearance: She is well-developed.  HENT:     Head: Normocephalic and atraumatic.     Mouth/Throat:     Pharynx: No oropharyngeal exudate.  Eyes:     General: No scleral icterus.    Conjunctiva/sclera: Conjunctivae normal.     Pupils: Pupils are equal, round, and reactive to light.  Cardiovascular:     Rate and Rhythm: Normal rate and regular rhythm.     Heart sounds: Normal heart sounds. No murmur heard.  No friction rub. No gallop.   Pulmonary:     Effort: Pulmonary effort is normal.     Breath sounds: Normal breath sounds. No wheezing or rales.  Musculoskeletal:     Cervical back: Neck supple.  Skin:    General: Skin is warm and dry.  Neurological:     Mental Status: She is alert and oriented to person, place, and time.     No results found for this or any previous visit (from the past 24 hour(s)).  No results found.   ASSESSMENT and PLAN  1. Essential hypertension Controlled. Continue current regime.   2. Elevated triglycerides with high cholesterol Checking labs today, medications will be adjusted as needed.  - simvastatin (ZOCOR) 20 MG tablet; Take 1 tablet (20 mg total) by mouth daily. - Lipid panel; Future  3.  Type 2 diabetes mellitus with hyperglycemia, without long-term current use of insulin (HCC) Last a1c not at goal. Keep metformin BID, add glipizide daily, discussed LFM - Ambulatory referral to Ophthalmology - metFORMIN (GLUCOPHAGE) 500 MG tablet; Take 1 tablet (500 mg total) by mouth 2 (two) times daily with a meal. - Hemoglobin A1c; Future - Comprehensive metabolic panel; Future  4. Age-related osteoporosis without current pathological fracture Managed by onc  Other orders - metoprolol tartrate (LOPRESSOR) 50 MG tablet; Take 1 tablet (50 mg total) by mouth 2 (two) times daily. - glipiZIDE (GLUCOTROL) 5 MG tablet; Take 1 tablet (5 mg total) by mouth daily before breakfast.  No follow-ups on file.    Rutherford Guys, MD [facility] at [facility] [place], [place] [address] Ph.  [phone] Fax [phone]

## 2019-08-09 NOTE — Patient Instructions (Signed)
° ° ° °  If you have lab work done today you will be contacted with your lab results within the next 2 weeks.  If you have not heard from us then please contact us. The fastest way to get your results is to register for My Chart. ° ° °IF you received an x-ray today, you will receive an invoice from [facility]. Please contact [facility] at [phone] with questions or concerns regarding your invoice.  ° °IF you received labwork today, you will receive an invoice from [facility]. Please contact [facility] at [phone] with questions or concerns regarding your invoice.  ° °Our billing staff will not be able to assist you with questions regarding bills from these companies. ° °You will be contacted with the lab results as soon as they are available. The fastest way to get your results is to activate your My Chart account. Instructions are located on the last page of this paperwork. If you have not heard from us regarding the results in 2 weeks, please contact this office. °  ° ° ° °

## 2019-11-01 ENCOUNTER — Other Ambulatory Visit: Payer: Self-pay

## 2019-11-01 ENCOUNTER — Ambulatory Visit (INDEPENDENT_AMBULATORY_CARE_PROVIDER_SITE_OTHER): Payer: Medicare Other | Admitting: Family Medicine

## 2019-11-01 DIAGNOSIS — E1165 Type 2 diabetes mellitus with hyperglycemia: Secondary | ICD-10-CM

## 2019-11-01 DIAGNOSIS — E782 Mixed hyperlipidemia: Secondary | ICD-10-CM

## 2019-11-02 LAB — COMPREHENSIVE METABOLIC PANEL
ALT: 26 IU/L (ref 0–32)
AST: 22 IU/L (ref 0–40)
Albumin/Globulin Ratio: 1.7 (ref 1.2–2.2)
Albumin: 4.5 g/dL (ref 3.8–4.8)
Alkaline Phosphatase: 59 IU/L (ref 48–121)
BUN/Creatinine Ratio: 15 (ref 12–28)
BUN: 14 mg/dL (ref 8–27)
Bilirubin Total: 0.7 mg/dL (ref 0.0–1.2)
CO2: 25 mmol/L (ref 20–29)
Calcium: 9.6 mg/dL (ref 8.7–10.3)
Chloride: 105 mmol/L (ref 96–106)
Creatinine, Ser: 0.94 mg/dL (ref 0.57–1.00)
GFR calc Af Amer: 74 mL/min/{1.73_m2} (ref >59)
GFR calc non Af Amer: 64 mL/min/{1.73_m2} (ref >59)
Globulin, Total: 2.6 g/dL (ref 1.5–4.5)
Glucose: 164 mg/dL — ABNORMAL HIGH (ref 65–99)
Potassium: 4.5 mmol/L (ref 3.5–5.2)
Sodium: 143 mmol/L (ref 134–144)
Total Protein: 7.1 g/dL (ref 6.0–8.5)

## 2019-11-02 LAB — LIPID PANEL
Chol/HDL Ratio: 3.3 ratio (ref 0.0–4.4)
Cholesterol, Total: 130 mg/dL (ref 100–199)
HDL: 39 mg/dL — ABNORMAL LOW (ref >39)
LDL Chol Calc (NIH): 61 mg/dL (ref 0–99)
Triglycerides: 179 mg/dL — ABNORMAL HIGH (ref 0–149)
VLDL Cholesterol Cal: 30 mg/dL (ref 5–40)

## 2019-11-02 LAB — HEMOGLOBIN A1C
Est. average glucose Bld gHb Est-mCnc: 128 mg/dL
Hgb A1c MFr Bld: 6.1 % — ABNORMAL HIGH (ref 4.8–5.6)

## 2019-11-08 ENCOUNTER — Other Ambulatory Visit: Payer: Self-pay

## 2019-11-08 ENCOUNTER — Ambulatory Visit (INDEPENDENT_AMBULATORY_CARE_PROVIDER_SITE_OTHER): Payer: Medicare Other | Admitting: Family Medicine

## 2019-11-08 ENCOUNTER — Encounter: Payer: Self-pay | Admitting: Family Medicine

## 2019-11-08 VITALS — BP 127/82 | HR 82 | Temp 97.8°F | Ht 59.0 in | Wt 122.0 lb

## 2019-11-08 DIAGNOSIS — Z23 Encounter for immunization: Secondary | ICD-10-CM | POA: Diagnosis not present

## 2019-11-08 DIAGNOSIS — E7800 Pure hypercholesterolemia, unspecified: Secondary | ICD-10-CM | POA: Diagnosis not present

## 2019-11-08 DIAGNOSIS — I10 Essential (primary) hypertension: Secondary | ICD-10-CM | POA: Diagnosis not present

## 2019-11-08 DIAGNOSIS — E1165 Type 2 diabetes mellitus with hyperglycemia: Secondary | ICD-10-CM

## 2019-11-08 NOTE — Patient Instructions (Signed)
pcp    If you have lab work done today you will be contacted with your lab results within the next 2 weeks.  If you have not heard from us then please contact us. The fastest way to get your results is to register for My Chart.   IF you received an x-ray today, you will receive an invoice from [facility]. Please contact [facility] at [phone] with questions or concerns regarding your invoice.   IF you received labwork today, you will receive an invoice from [facility]. Please contact [facility] at [phone] with questions or concerns regarding your invoice.   Our billing staff will not be able to assist you with questions regarding bills from these companies.  You will be contacted with the lab results as soon as they are available. The fastest way to get your results is to activate your My Chart account. Instructions are located on the last page of this paperwork. If you have not heard from us regarding the results in 2 weeks, please contact this office.     

## 2019-11-08 NOTE — Progress Notes (Addendum)
9/14/20218:48 AM  [name] Dec 06, 1955, 64 y.o., female [id number]  Chief Complaint  Patient presents with  . Diabetes  . Hypertension  . Hyperlipidemia    HPI:   Patient is a 64 y.o. female with past medical history significant for HTN, DM2, HLP, R breast cancer, GERD, osteoporosis, anemia  who presents today for routine followup  Last OV sept 2021 - added glipizide  She is overall doing well She has no acute concerns She did not tolerate glipizide - was making her dizzy She has decreased rice intake significantly She continues to take metformin, simvastatin and metoprolol She is requesting flu vaccine today Daughter serves as interpreter per patient's preference  Lab Results  Component Value Date   HGBA1C 6.1 (H) 11/01/2019   HGBA1C 8.9 (A) 05/17/2019   HGBA1C 8.3 (A) 02/15/2019   Lab Results  Component Value Date   MICROALBUR 1.2 12/01/2015   [place] 61 11/01/2019   CREATININE 0.94 11/01/2019    Depression screen PHQ 2/9 05/17/2019 02/15/2019 09/01/2018  Decreased Interest 0 0 0  Down, Depressed, Hopeless 0 0 0  PHQ - 2 Score 0 0 0    Fall Risk  11/08/2019 08/09/2019 05/17/2019 02/15/2019 09/01/2018  Falls in the past year? 0 0 0 0 0  Number falls in past yr: 0 0 0 0 0  Injury with Fall? 0 0 0 0 0  Follow up - - Falls evaluation completed Falls evaluation completed Falls evaluation completed     Allergies  Allergen Reactions  . Asa Buff (Mag [Buffered Aspirin] Nausea And Vomiting    Prior to Admission medications   Medication Sig Start Date End Date Taking? Authorizing Provider  alendronate (FOSAMAX) 35 MG tablet Take 1 tablet (35 mg total) by mouth every 7 (seven) days. Take with a full glass of water on an empty stomach. 01/31/19  Yes Truitt Merle, MD  Cholecalciferol (VITAMIN D3) 50 MCG (2000 UT) TABS Take 1 tablet by mouth daily. 11/24/18  Yes Wendall Mola, NP  metFORMIN (GLUCOPHAGE) 500 MG tablet Take 1 tablet (500 mg total) by mouth 2 (two)  times daily with a meal. 08/09/19  Yes Rutherford Guys, MD  metoprolol tartrate (LOPRESSOR) 50 MG tablet Take 1 tablet (50 mg total) by mouth 2 (two) times daily. 08/09/19  Yes Rutherford Guys, MD  Multiple Vitamin (MULITIVITAMIN WITH MINERALS) TABS Take 2 tablets by mouth daily. Reported on 08/30/2015   Yes [provider]  simvastatin (ZOCOR) 20 MG tablet Take 1 tablet (20 mg total) by mouth daily. 08/09/19  Yes Rutherford Guys, MD    Past Medical History:  Diagnosis Date  . Anxiety    ATIVAN HELPS PT TO SLEEP  . Breast cancer ([place])    breast right side -COMPLETED CHEMO WITH DR. Collier Salina RUBIN--EXCEPT ARIMIDEX  . Cough    PT HAS HAD COUGH FOR PAST MONTH-WHITE PHLEGM  . Hyperlipidemia   . Hypertension   . Indigestion    TAKING ZANTAC  . Personal history of chemotherapy 2012    Past Surgical History:  Procedure Laterality Date  . MASTECTOMY  09/12/10   right  . PORT-A-CATH REMOVAL  03/19/2011   Procedure: REMOVAL PORT-A-CATH;  Surgeon: Stark Klein, MD;  Location: [facility];  Service: General;  Laterality: N/A;  . PORTACATH PLACEMENT      Social History   Tobacco Use  . Smoking status: Never Smoker  . Smokeless tobacco: Never Used  Substance Use Topics  . Alcohol use: No  Family History  Problem Relation Age of Onset  . Hypertension Mother   . Diabetes Mother   . Cancer Father        unknown type cancer   . Diabetes Sister   . Hypertension Sister   . Cancer Sister   . Thyroid disease Sister   . Breast cancer Neg Hx     Review of Systems  Constitutional: Negative for chills and fever.  Respiratory: Negative for cough and shortness of breath.   Cardiovascular: Negative for chest pain, palpitations and leg swelling.  Gastrointestinal: Negative for abdominal pain, nausea and vomiting.     OBJECTIVE:  Today's Vitals   11/08/19 0837  BP: 127/82  Pulse: 82  Temp: 97.8 F (36.6 C)  SpO2: 100%  Weight: 122 lb (55.3 kg)  Height: 4\' 11"  (1.499 m)   Body  mass index is 24.64 kg/m.  Wt Readings from Last 3 Encounters:  11/08/19 122 lb (55.3 kg)  08/09/19 125 lb 6.4 oz (56.9 kg)  05/17/19 128 lb (58.1 kg)    Physical Exam Vitals and nursing note reviewed.  Constitutional:      Appearance: She is well-developed.  HENT:     Head: Normocephalic and atraumatic.     Mouth/Throat:     Pharynx: No oropharyngeal exudate.  Eyes:     General: No scleral icterus.    Extraocular Movements: Extraocular movements intact.     Conjunctiva/sclera: Conjunctivae normal.     Pupils: Pupils are equal, round, and reactive to light.  Cardiovascular:     Rate and Rhythm: Normal rate and regular rhythm.     Heart sounds: Normal heart sounds. No murmur heard.  No friction rub. No gallop.   Pulmonary:     Effort: Pulmonary effort is normal.     Breath sounds: Normal breath sounds. No wheezing, rhonchi or rales.  Musculoskeletal:     Cervical back: Neck supple.  Skin:    General: Skin is warm and dry.  Neurological:     Mental Status: She is alert and oriented to person, place, and time.      Diabetic Foot Form - Detailed   No data filed      No results found for this or any previous visit (from the past 24 hour(s)).  No results found.   ASSESSMENT and PLAN  1. Type 2 diabetes mellitus with hyperglycemia, without long-term current use of insulin (HCC) Controlled. Continue current regime.  - HM DIABETES FOOT EXAM - Microalbumin / creatinine urine ratio  2. Essential hypertension Controlled. Continue current regime.   3. Elevated cholesterol Controlled. Continue current regime.   4. Need for prophylactic vaccination and inoculation against influenza - Flu Vaccine QUAD 36+ mos IM  Return in about 6 months (around 05/07/2020) for fasting labs 3-4 days prior.    Rutherford Guys, MD Primary Care at [place], [place] [address] Ph.  [phone] Fax [phone]

## 2019-11-09 LAB — MICROALBUMIN / CREATININE URINE RATIO
Creatinine, Urine: 114.8 mg/dL
Microalb/Creat Ratio: 22 mg/g creat (ref 0–29)
Microalbumin, Urine: 24.9 ug/mL

## 2019-11-21 DIAGNOSIS — H2513 Age-related nuclear cataract, bilateral: Secondary | ICD-10-CM | POA: Diagnosis not present

## 2019-11-21 DIAGNOSIS — E119 Type 2 diabetes mellitus without complications: Secondary | ICD-10-CM | POA: Diagnosis not present

## 2019-11-21 LAB — HM DIABETES EYE EXAM

## 2020-01-02 ENCOUNTER — Ambulatory Visit: Payer: Medicare Other | Admitting: Registered Nurse

## 2020-02-16 ENCOUNTER — Encounter: Payer: Self-pay | Admitting: Family Medicine

## 2020-02-16 ENCOUNTER — Ambulatory Visit (INDEPENDENT_AMBULATORY_CARE_PROVIDER_SITE_OTHER): Payer: Medicare Other | Admitting: Family Medicine

## 2020-02-16 ENCOUNTER — Other Ambulatory Visit: Payer: Self-pay

## 2020-02-16 VITALS — BP 164/86 | HR 85 | Temp 97.7°F | Resp 15 | Ht 59.0 in | Wt 124.6 lb

## 2020-02-16 DIAGNOSIS — R300 Dysuria: Secondary | ICD-10-CM

## 2020-02-16 DIAGNOSIS — M810 Age-related osteoporosis without current pathological fracture: Secondary | ICD-10-CM | POA: Diagnosis not present

## 2020-02-16 DIAGNOSIS — M1990 Unspecified osteoarthritis, unspecified site: Secondary | ICD-10-CM | POA: Diagnosis not present

## 2020-02-16 DIAGNOSIS — E7800 Pure hypercholesterolemia, unspecified: Secondary | ICD-10-CM | POA: Diagnosis not present

## 2020-02-16 DIAGNOSIS — I10 Essential (primary) hypertension: Secondary | ICD-10-CM | POA: Diagnosis not present

## 2020-02-16 DIAGNOSIS — E1165 Type 2 diabetes mellitus with hyperglycemia: Secondary | ICD-10-CM

## 2020-02-16 DIAGNOSIS — R82998 Other abnormal findings in urine: Secondary | ICD-10-CM

## 2020-02-16 DIAGNOSIS — Z1231 Encounter for screening mammogram for malignant neoplasm of breast: Secondary | ICD-10-CM

## 2020-02-16 LAB — POCT URINALYSIS DIP (MANUAL ENTRY)
Bilirubin, UA: NEGATIVE
Blood, UA: NEGATIVE
Glucose, UA: NEGATIVE mg/dL
Ketones, POC UA: NEGATIVE mg/dL
Nitrite, UA: NEGATIVE
Protein Ur, POC: NEGATIVE mg/dL
Spec Grav, UA: 1.02 (ref 1.010–1.025)
Urobilinogen, UA: 0.2 E.U./dL
pH, UA: 5.5 (ref 5.0–8.0)

## 2020-02-16 MED ORDER — ALENDRONATE SODIUM 35 MG PO TABS: 35.0000 mg | ORAL_TABLET | ORAL | 3 refills | Status: DC

## 2020-02-16 MED ORDER — DICLOFENAC SODIUM 1 % EX GEL: 4.0000 g | Freq: Four times a day (QID) | CUTANEOUS | 3 refills | Status: DC

## 2020-02-16 NOTE — Progress Notes (Signed)
12/23/20219:11 AM  [name] 1955-11-18, 64 y.o., female [id number]  Chief Complaint  Patient presents with  . Establish Care    Pt here for transfer of care, needs a refill of Fosamax.   . Dysmenorrhea    Pt has been having some discomfort and low back pain x2-3 days and is concerned she has an infection     HPI:   Patient is a 64 y.o. female with past medical history significant for HTN, DM2, HLP, R breast cancer, GERD, osteoporosis, anemia who presents today for routine follow-up.  Stays busy cooking and with garden Lives with daughter Breast cancer with chemo, didn't do radiation   HTN Metoprolol 2m bid Doesn't take BP at home Has not taken medications this morning BP Readings from Last 3 Encounters:  02/16/20 (!) 164/86  11/08/19 127/82  08/09/19 129/84   HLD Simvastatin 290mdaily Lab Results  Component Value Date   CHOL 130 11/01/2019   HDL 39 (L) 11/01/2019   LDLCALC 61 11/01/2019   TRIG 179 (H) 11/01/2019   CHOLHDL 3.3 11/01/2019   DM Metformin 50017mid Had previously tired glipizide Eye exam 11/21/19 Lab Results  Component Value Date   HGBA1C 6.1 (H) 11/01/2019   Osteoporosis Fosamax weekly Vit D3 daily Last DEXA 05/05/19 Arthritis  Screening Mammogram: 11/29/15 Pap: 11/18/16 nilm HPV neg Cologuard: 09/01/17 negative (due 7/22)   [facility] 02/16/2020 05/17/2019 02/15/2019  Decreased Interest 0 0 0  Down, Depressed, Hopeless 0 0 0  PHQ - 2 Score 0 0 0    Fall Risk  02/16/2020 11/08/2019 08/09/2019 05/17/2019 02/15/2019  Falls in the past year? 0 0 0 0 0  Number falls in past yr: 0 0 0 0 0  Injury with Fall? 0 0 0 0 0  Risk for fall due to : No Fall Risks - - - -  Follow up Falls evaluation completed - - Falls evaluation completed Falls evaluation completed     Allergies  Allergen Reactions  . Asa Buff (Mag [Buffered Aspirin] Nausea And Vomiting    Prior to Admission medications   Medication Sig Start Date End  Date Taking? Authorizing Provider  alendronate (FOSAMAX) 35 MG tablet Take 1 tablet (35 mg total) by mouth every 7 (seven) days. Take with a full glass of water on an empty stomach. 01/31/19  Yes FenTruitt MerleD  Cholecalciferol (VITAMIN D3) 50 MCG (2000 UT) TABS Take 1 tablet by mouth daily. 11/24/18  Yes ByrWendall MolaP  metFORMIN (GLUCOPHAGE) 500 MG tablet Take 1 tablet (500 mg total) by mouth 2 (two) times daily with a meal. 08/09/19  Yes San[place]elyn PirmLilia ArgueD  metoprolol tartrate (LOPRESSOR) 50 MG tablet Take 1 tablet (50 mg total) by mouth 2 (two) times daily. 08/09/19  Yes San[place]elyn PirmLilia ArgueD  Multiple Vitamin (MULITIVITAMIN WITH MINERALS) TABS Take 2 tablets by mouth daily. Reported on 08/30/2015   Yes [provider]  simvastatin (ZOCOR) 20 MG tablet Take 1 tablet (20 mg total) by mouth daily. 08/09/19  Yes SanJacelyn PirmLilia ArgueD    Past Medical History:  Diagnosis Date  . Anxiety    ATIVAN HELPS PT TO SLEEP  . Breast cancer ([place]  breast right side -COMPLETED CHEMO WITH DR. PETCollier SalinaBIN--EXCEPT ARIMIDEX  . Cough    PT HAS HAD COUGH FOR PAST MONTH-WHITE PHLEGM  . Hyperlipidemia   . Hypertension   . Indigestion    TAKING ZANTAC  .  Personal history of chemotherapy 2012    Past Surgical History:  Procedure Laterality Date  . MASTECTOMY  09/12/10   right  . PORT-A-CATH REMOVAL  03/19/2011   Procedure: REMOVAL PORT-A-CATH;  Surgeon: Stark Klein, MD;  Location: [facility];  Service: General;  Laterality: N/A;  . PORTACATH PLACEMENT      Social History   Tobacco Use  . Smoking status: Never Smoker  . Smokeless tobacco: Never Used  Substance Use Topics  . Alcohol use: No    Family History  Problem Relation Age of Onset  . Hypertension Mother   . Diabetes Mother   . Cancer Father        unknown type cancer   . Diabetes Sister   . Hypertension Sister   . Cancer Sister   . Thyroid disease Sister   . Breast cancer Neg Hx     Review of Systems   Constitutional: Negative for chills, fever and malaise/fatigue.  HENT: Negative for hearing loss and tinnitus.   Eyes: Negative for blurred vision and double vision.  Respiratory: Negative for cough, shortness of breath and wheezing.   Cardiovascular: Negative for chest pain, palpitations and leg swelling.  Gastrointestinal: Positive for heartburn. Negative for abdominal pain, blood in stool, constipation, diarrhea, nausea and vomiting.  Genitourinary: Positive for dysuria and frequency. Negative for hematuria and urgency.       Small amount when she goes  Musculoskeletal: Positive for joint pain (knee pain with walking, wrist pain). Negative for back pain.  Skin: Negative for rash.  Neurological: Negative for dizziness, weakness and headaches.     OBJECTIVE:  Today's Vitals   02/16/20 0812 02/16/20 0859  BP: (!) 172/84 (!) 164/86  Pulse: 85   Resp: 15   Temp: 97.7 F (36.5 C)   TempSrc: Temporal   SpO2: 98%   Weight: 124 lb 9.6 oz (56.5 kg)   Height: '4\' 11"'  (1.499 m)    Body mass index is 25.17 kg/m.   Physical Exam Constitutional:      General: She is not in acute distress.    Appearance: Normal appearance. She is not ill-appearing.  HENT:     Head: Normocephalic.  Cardiovascular:     Rate and Rhythm: Normal rate and regular rhythm.     Pulses: Normal pulses.     Heart sounds: Normal heart sounds. No murmur heard. No friction rub. No gallop.   Pulmonary:     Effort: Pulmonary effort is normal. No respiratory distress.     Breath sounds: Normal breath sounds. No stridor. No wheezing, rhonchi or rales.  Abdominal:     General: Bowel sounds are normal.     Palpations: Abdomen is soft.     Tenderness: There is no abdominal tenderness.  Musculoskeletal:     Right lower leg: No edema.     Left lower leg: No edema.  Skin:    General: Skin is warm and dry.  Neurological:     Mental Status: She is alert and oriented to person, place, and time.  Psychiatric:         Mood and Affect: Mood normal.        Behavior: Behavior normal.     Results for orders placed or performed in visit on 02/16/20 (from the past 24 hour(s))  POCT urinalysis dipstick     Status: Abnormal   Collection Time: 02/16/20  9:00 AM  Result Value Ref Range   Color, UA yellow yellow   Clarity, UA clear clear  Glucose, UA negative negative mg/dL   Bilirubin, UA negative negative   Ketones, POC UA negative negative mg/dL   Spec Grav, UA 1.020 1.010 - 1.025   Blood, UA negative negative   pH, UA 5.5 5.0 - 8.0   Protein Ur, POC negative negative mg/dL   Urobilinogen, UA 0.2 0.2 or 1.0 E.U./dL   Nitrite, UA Negative Negative   Leukocytes, UA Trace (A) Negative    No results found.   ASSESSMENT and PLAN  Problem List Items Addressed This Visit      Cardiovascular and Mediastinum   Essential hypertension (Chronic)  Encouraged to monitor BP at home Had not taken medications this morning BP not at goal this visit, was at prior visits     Musculoskeletal and Integument   Osteoporosis - Primary   Relevant Medications   alendronate (FOSAMAX) 35 MG tablet   Other Relevant Orders   Vitamin D, 25-hydroxy     Other   Elevated cholesterol (Chronic) LDL at goal on Sept labs. Continue current regimen.    Other Visit Diagnoses    Type 2 diabetes mellitus with hyperglycemia, without long-term current use of insulin ([place])       Relevant Orders   Hemoglobin A1c Will follow up with lab results. Last A1c at goal   Dysuria       Relevant Orders   POCT urinalysis dipstick: Trace Leukocytes Urine Culture order placed   CMP14+EGFR  If urinalysis is normal with refer to urology.   Encounter for screening mammogram for malignant neoplasm of breast       Relevant Orders   MS DIGITAL SCREENING TOMO BILATERAL   Arthritis       Relevant Medications   diclofenac Sodium (VOLTAREN) 1 % GEL Did not want oral medications at this time       Return in about 3 months (around  05/16/2020).    Huston Foley Sabrina Schiller, FNP-BC Primary Care at [place], [place] [address] Ph.  [phone] Fax [phone]

## 2020-02-16 NOTE — Patient Instructions (Addendum)
Goal BP< 130/80 Take BP at home   Health Maintenance, Female Adopting a healthy lifestyle and getting preventive care are important in promoting health and wellness. Ask your health care provider about:  The right schedule for you to have regular tests and exams.  Things you can do on your own to prevent diseases and keep yourself healthy. What should I know about diet, weight, and exercise? Eat a healthy diet   Eat a diet that includes plenty of vegetables, fruits, low-fat dairy products, and lean protein.  Do not eat a lot of foods that are high in solid fats, added sugars, or sodium. Maintain a healthy weight Body mass index (BMI) is used to identify weight problems. It estimates body fat based on height and weight. Your health care provider can help determine your BMI and help you achieve or maintain a healthy weight. Get regular exercise Get regular exercise. This is one of the most important things you can do for your health. Most adults should:  Exercise for at least 150 minutes each week. The exercise should increase your heart rate and make you sweat (moderate-intensity exercise).  Do strengthening exercises at least twice a week. This is in addition to the moderate-intensity exercise.  Spend less time sitting. Even light physical activity can be beneficial. Watch cholesterol and blood lipids Have your blood tested for lipids and cholesterol at 64 years of age, then have this test every 5 years. Have your cholesterol levels checked more often if:  Your lipid or cholesterol levels are high.  You are older than 64 years of age.  You are at high risk for heart disease. What should I know about cancer screening? Depending on your health history and family history, you may need to have cancer screening at various ages. This may include screening for:  Breast cancer.  Cervical cancer.  Colorectal cancer.  Skin cancer.  Lung cancer. What should I know about heart  disease, diabetes, and high blood pressure? Blood pressure and heart disease  High blood pressure causes heart disease and increases the risk of stroke. This is more likely to develop in people who have high blood pressure readings, are of African descent, or are overweight.  Have your blood pressure checked: ? Every 3-5 years if you are 64-64 years of age. ? Every year if you are 64 years old or older. Diabetes Have regular diabetes screenings. This checks your fasting blood sugar level. Have the screening done:  Once every three years after age 11 if you are at a normal weight and have a low risk for diabetes.  More often and at a younger age if you are overweight or have a high risk for diabetes. What should I know about preventing infection? Hepatitis B If you have a higher risk for hepatitis B, you should be screened for this virus. Talk with your health care provider to find out if you are at risk for hepatitis B infection. Hepatitis C Testing is recommended for:  Everyone born from [birth year] through 1965.  Anyone with known risk factors for hepatitis C. Sexually transmitted infections (STIs)  Get screened for STIs, including gonorrhea and chlamydia, if: ? You are sexually active and are younger than 64 years of age. ? You are older than 64 years of age and your health care provider tells you that you are at risk for this type of infection. ? Your sexual activity has changed since you were last screened, and you are at increased risk for  chlamydia or gonorrhea. Ask your health care provider if you are at risk.  Ask your health care provider about whether you are at high risk for HIV. Your health care provider may recommend a prescription medicine to help prevent HIV infection. If you choose to take medicine to prevent HIV, you should first get tested for HIV. You should then be tested every 3 months for as long as you are taking the medicine. Pregnancy  If you are about to stop  having your period (premenopausal) and you may become pregnant, seek counseling before you get pregnant.  Take 400 to 800 micrograms (mcg) of folic acid every day if you become pregnant.  Ask for birth control (contraception) if you want to prevent pregnancy. Osteoporosis and menopause Osteoporosis is a disease in which the bones lose minerals and strength with aging. This can result in bone fractures. If you are 70 years old or older, or if you are at risk for osteoporosis and fractures, ask your health care provider if you should:  Be screened for bone loss.  Take a calcium or vitamin D supplement to lower your risk of fractures.  Be given hormone replacement therapy (HRT) to treat symptoms of menopause. Follow these instructions at home: Lifestyle  Do not use any products that contain nicotine or tobacco, such as cigarettes, e-cigarettes, and chewing tobacco. If you need help quitting, ask your health care provider.  Do not use street drugs.  Do not share needles.  Ask your health care provider for help if you need support or information about quitting drugs. Alcohol use  Do not drink alcohol if: ? Your health care provider tells you not to drink. ? You are pregnant, may be pregnant, or are planning to become pregnant.  If you drink alcohol: ? Limit how much you use to 0-1 drink a day. ? Limit intake if you are breastfeeding.  Be aware of how much alcohol is in your drink. In the [place] one drink equals one 12 oz bottle of beer (355 mL), one 5 oz glass of wine (148 mL), or one 1 oz glass of hard liquor (44 mL). General instructions  Schedule regular health, dental, and eye exams.  Stay current with your vaccines.  Tell your health care provider if: ? You often feel depressed. ? You have ever been abused or do not feel safe at home. Summary  Adopting a healthy lifestyle and getting preventive care are important in promoting health and wellness.  Follow your health  care provider's instructions about healthy diet, exercising, and getting tested or screened for diseases.  Follow your health care provider's instructions on monitoring your cholesterol and blood pressure. This information is not intended to replace advice given to you by your health care provider. Make sure you discuss any questions you have with your health care provider. Document Revised: 02/03/2018 Document Reviewed: 02/03/2018 Elsevier Patient Education  [employer].  If you have lab work done today you will be contacted with your lab results within the next 2 weeks.  If you have not heard from [place] then please contact us. The fastest way to get your results is to register for My Chart.   IF you received an x-ray today, you will receive an invoice from [facility]. Please contact [facility] at [phone] with questions or concerns regarding your invoice.   IF you received labwork today, you will receive an invoice from [facility]. Please contact [facility] at [phone] with questions or concerns regarding your  invoice.   Our billing staff will not be able to assist you with questions regarding bills from these companies.  You will be contacted with the lab results as soon as they are available. The fastest way to get your results is to activate your My Chart account. Instructions are located on the last page of this paperwork. If you have not heard from [place] regarding the results in 2 weeks, please contact this office.

## 2020-02-17 LAB — CMP14+EGFR
ALT: 22 IU/L (ref 0–32)
AST: 19 IU/L (ref 0–40)
Albumin/Globulin Ratio: 1.6 (ref 1.2–2.2)
Albumin: 4.4 g/dL (ref 3.8–4.8)
Alkaline Phosphatase: 68 IU/L (ref 44–121)
BUN/Creatinine Ratio: 16 (ref 12–28)
BUN: 13 mg/dL (ref 8–27)
Bilirubin Total: 0.3 mg/dL (ref 0.0–1.2)
CO2: 25 mmol/L (ref 20–29)
Calcium: 9.6 mg/dL (ref 8.7–10.3)
Chloride: 99 mmol/L (ref 96–106)
Creatinine, Ser: 0.81 mg/dL (ref 0.57–1.00)
GFR calc Af Amer: 89 mL/min/{1.73_m2} (ref >59)
GFR calc non Af Amer: 77 mL/min/{1.73_m2} (ref >59)
Globulin, Total: 2.7 g/dL (ref 1.5–4.5)
Glucose: 163 mg/dL — ABNORMAL HIGH (ref 65–99)
Potassium: 4.4 mmol/L (ref 3.5–5.2)
Sodium: 140 mmol/L (ref 134–144)
Total Protein: 7.1 g/dL (ref 6.0–8.5)

## 2020-02-17 LAB — HEMOGLOBIN A1C
Est. average glucose Bld gHb Est-mCnc: 169 mg/dL
Hgb A1c MFr Bld: 7.5 % — ABNORMAL HIGH (ref 4.8–5.6)

## 2020-02-17 LAB — VITAMIN D 25 HYDROXY (VIT D DEFICIENCY, FRACTURES): Vit D, 25-Hydroxy: 47 ng/mL (ref 30.0–100.0)

## 2020-02-20 ENCOUNTER — Other Ambulatory Visit: Payer: Self-pay | Admitting: Family Medicine

## 2020-02-20 ENCOUNTER — Telehealth: Payer: Self-pay | Admitting: Family Medicine

## 2020-02-20 DIAGNOSIS — N3001 Acute cystitis with hematuria: Secondary | ICD-10-CM

## 2020-02-20 DIAGNOSIS — E1165 Type 2 diabetes mellitus with hyperglycemia: Secondary | ICD-10-CM

## 2020-02-20 MED ORDER — TRULICITY 0.75 MG/0.5ML ~~LOC~~ SOAJ: 0.7500 mg | SUBCUTANEOUS | 4 refills | Status: DC

## 2020-02-20 MED ORDER — TRULICITY 1.5 MG/0.5ML ~~LOC~~ SOAJ: 1.5000 mg | SUBCUTANEOUS | 10 refills | Status: DC

## 2020-02-20 MED ORDER — NITROFURANTOIN MONOHYD MACRO 100 MG PO CAPS: 100.0000 mg | ORAL_CAPSULE | Freq: Two times a day (BID) | ORAL | 0 refills | Status: AC

## 2020-02-20 MED ORDER — DAPAGLIFLOZIN PROPANEDIOL 5 MG PO TABS: 5.0000 mg | ORAL_TABLET | Freq: Every day | ORAL | 3 refills | Status: DC

## 2020-02-20 NOTE — Telephone Encounter (Signed)
Sounds good. I discontinued that order and we can start daily Farxiga. That one is a pill. Thanks for letting me know!

## 2020-02-20 NOTE — Progress Notes (Signed)
If you could let Finola know I sent two medications to her pharmacy. Her A1c is quite elevated from last visit I am going to have her start on weekly Trulicity at .75 mg and after 4 weeks increase to 1.5 mg weekly. All of this has been sent to her pharmacy. Continue on the metformin twice a day. It also looks like she has a UTI. I sent macrobid to her pharmacy for her to take twice a day for 5 days.

## 2020-02-20 NOTE — Telephone Encounter (Signed)
Pt does not want injectable

## 2020-02-20 NOTE — Telephone Encounter (Signed)
Patient was prescribed  Dulaglutide (TRULICITY) 1.5 XB/2.8UX SOPN [[id number]]    Patient will not not take injection. Patient prefers pills instead. Please advise at [phone]. New prescription needed.

## 2020-02-22 LAB — URINE CULTURE

## 2020-04-04 ENCOUNTER — Other Ambulatory Visit: Payer: Self-pay

## 2020-04-04 ENCOUNTER — Ambulatory Visit
Admission: RE | Admit: 2020-04-04 | Discharge: 2020-04-04 | Disposition: A | Discharge status: Home / Self Care | Payer: Medicare Other | Source: Ambulatory Visit | Attending: Family Medicine | Admitting: Family Medicine

## 2020-04-04 DIAGNOSIS — Z1231 Encounter for screening mammogram for malignant neoplasm of breast: Secondary | ICD-10-CM

## 2020-04-18 ENCOUNTER — Telehealth: Payer: Self-pay | Admitting: Nurse Practitioner

## 2020-04-18 NOTE — Telephone Encounter (Signed)
Attempted to contact patient about moved appointment times per providers template. Left a detailed message.

## 2020-05-02 ENCOUNTER — Telehealth: Payer: Self-pay

## 2020-05-02 DIAGNOSIS — E1165 Type 2 diabetes mellitus with hyperglycemia: Secondary | ICD-10-CM

## 2020-05-02 MED ORDER — METFORMIN HCL 500 MG PO TABS: 500.0000 mg | ORAL_TABLET | Freq: Two times a day (BID) | ORAL | 1 refills | Status: DC

## 2020-05-02 NOTE — Telephone Encounter (Signed)
Pt. Called requesting refill on metformin until appt. On 05/16/2020

## 2020-05-16 ENCOUNTER — Other Ambulatory Visit: Payer: Self-pay

## 2020-05-16 ENCOUNTER — Ambulatory Visit (INDEPENDENT_AMBULATORY_CARE_PROVIDER_SITE_OTHER): Payer: Medicare Other | Admitting: Family Medicine

## 2020-05-16 ENCOUNTER — Encounter: Payer: Self-pay | Admitting: Family Medicine

## 2020-05-16 VITALS — BP 132/82 | HR 76 | Temp 97.5°F | Ht 59.0 in | Wt 123.0 lb

## 2020-05-16 DIAGNOSIS — E782 Mixed hyperlipidemia: Secondary | ICD-10-CM

## 2020-05-16 DIAGNOSIS — E1165 Type 2 diabetes mellitus with hyperglycemia: Secondary | ICD-10-CM

## 2020-05-16 DIAGNOSIS — M810 Age-related osteoporosis without current pathological fracture: Secondary | ICD-10-CM

## 2020-05-16 LAB — POCT GLYCOSYLATED HEMOGLOBIN (HGB A1C): HbA1c POC (<> result, manual entry): 7 % (ref 4.0–5.6)

## 2020-05-16 LAB — GLUCOSE, POCT (MANUAL RESULT ENTRY): POC Glucose: 145 mg/dl — AB (ref 70–99)

## 2020-05-16 MED ORDER — METFORMIN HCL 500 MG PO TABS: 500.0000 mg | ORAL_TABLET | Freq: Two times a day (BID) | ORAL | 1 refills | Status: AC

## 2020-05-16 MED ORDER — METOPROLOL TARTRATE 50 MG PO TABS: 50.0000 mg | ORAL_TABLET | Freq: Two times a day (BID) | ORAL | 1 refills | Status: DC

## 2020-05-16 MED ORDER — SIMVASTATIN 20 MG PO TABS: 20.0000 mg | ORAL_TABLET | Freq: Every day | ORAL | 3 refills | Status: AC

## 2020-05-16 MED ORDER — ALENDRONATE SODIUM 35 MG PO TABS
35.0000 mg | ORAL_TABLET | ORAL | 3 refills | Status: AC
Start: 2020-05-16 — End: ?

## 2020-05-16 NOTE — Patient Instructions (Addendum)
  Health Maintenance, Female Adopting a healthy lifestyle and getting preventive care are important in promoting health and wellness. Ask your health care provider about:  The right schedule for you to have regular tests and exams.  Things you can do on your own to prevent diseases and keep yourself healthy. What should I know about diet, weight, and exercise? Eat a healthy diet  Eat a diet that includes plenty of vegetables, fruits, low-fat dairy products, and lean protein.  Do not eat a lot of foods that are high in solid fats, added sugars, or sodium.   Maintain a healthy weight Body mass index (BMI) is used to identify weight problems. It estimates body fat based on height and weight. Your health care provider can help determine your BMI and help you achieve or maintain a healthy weight. Get regular exercise Get regular exercise. This is one of the most important things you can do for your health. Most adults should:  Exercise for at least 150 minutes each week. The exercise should increase your heart rate and make you sweat (moderate-intensity exercise).  Do strengthening exercises at least twice a week. This is in addition to the moderate-intensity exercise.  Spend less time sitting. Even light physical activity can be beneficial. Watch cholesterol and blood lipids Have your blood tested for lipids and cholesterol at 65 years of age, then have this test every 5 years. Have your cholesterol levels checked more often if:  Your lipid or cholesterol levels are high.  You are older than 65 years of age.  You are at high risk for heart disease. What should I know about cancer screening? Depending on your health history and family history, you may need to have cancer screening at various ages. This may include screening for:  Breast cancer.  Cervical cancer.  Colorectal cancer.  Skin cancer.  Lung cancer. What should I know about heart disease, diabetes, and high blood  pressure? Blood pressure and heart disease  High blood pressure causes heart disease and increases the risk of stroke. This is more likely to develop in people who have high blood pressure readings, are of African descent, or are overweight.  Have your blood pressure checked: ? Every 3-5 years if you are 18-39 years of age. ? Every year if you are 40 years old or older. Diabetes Have regular diabetes screenings. This checks your fasting blood sugar level. Have the screening done:  Once every three years after age 40 if you are at a normal weight and have a low risk for diabetes.  More often and at a younger age if you are overweight or have a high risk for diabetes. What should I know about preventing infection? Hepatitis B If you have a higher risk for hepatitis B, you should be screened for this virus. Talk with your health care provider to find out if you are at risk for hepatitis B infection. Hepatitis C Testing is recommended for:  Everyone born from 1945 through 1965.  Anyone with known risk factors for hepatitis C. Sexually transmitted infections (STIs)  Get screened for STIs, including gonorrhea and chlamydia, if: ? You are sexually active and are younger than 65 years of age. ? You are older than 65 years of age and your health care provider tells you that you are at risk for this type of infection. ? Your sexual activity has changed since you were last screened, and you are at increased risk for chlamydia or gonorrhea. Ask your health   care provider if you are at risk.  Ask your health care provider about whether you are at high risk for HIV. Your health care provider may recommend a prescription medicine to help prevent HIV infection. If you choose to take medicine to prevent HIV, you should first get tested for HIV. You should then be tested every 3 months for as long as you are taking the medicine. Pregnancy  If you are about to stop having your period (premenopausal) and  you may become pregnant, seek counseling before you get pregnant.  Take 400 to 800 micrograms (mcg) of folic acid every day if you become pregnant.  Ask for birth control (contraception) if you want to prevent pregnancy. Osteoporosis and menopause Osteoporosis is a disease in which the bones lose minerals and strength with aging. This can result in bone fractures. If you are 65 years old or older, or if you are at risk for osteoporosis and fractures, ask your health care provider if you should:  Be screened for bone loss.  Take a calcium or vitamin D supplement to lower your risk of fractures.  Be given hormone replacement therapy (HRT) to treat symptoms of menopause. Follow these instructions at home: Lifestyle  Do not use any products that contain nicotine or tobacco, such as cigarettes, e-cigarettes, and chewing tobacco. If you need help quitting, ask your health care provider.  Do not use street drugs.  Do not share needles.  Ask your health care provider for help if you need support or information about quitting drugs. Alcohol use  Do not drink alcohol if: ? Your health care provider tells you not to drink. ? You are pregnant, may be pregnant, or are planning to become pregnant.  If you drink alcohol: ? Limit how much you use to 0-1 drink a day. ? Limit intake if you are breastfeeding.  Be aware of how much alcohol is in your drink. In the [place] one drink equals one 12 oz bottle of beer (355 mL), one 5 oz glass of wine (148 mL), or one 1 oz glass of hard liquor (44 mL). General instructions  Schedule regular health, dental, and eye exams.  Stay current with your vaccines.  Tell your health care provider if: ? You often feel depressed. ? You have ever been abused or do not feel safe at home. Summary  Adopting a healthy lifestyle and getting preventive care are important in promoting health and wellness.  Follow your health care provider's instructions about healthy  diet, exercising, and getting tested or screened for diseases.  Follow your health care provider's instructions on monitoring your cholesterol and blood pressure. This information is not intended to replace advice given to you by your health care provider. Make sure you discuss any questions you have with your health care provider. Document Revised: 02/03/2018 Document Reviewed: 02/03/2018 Elsevier Patient Education  2021 Elsevier Inc.   If you have lab work done today you will be contacted with your lab results within the next 2 weeks.  If you have not heard from us then please contact us. The fastest way to get your results is to register for My Chart.   IF you received an x-ray today, you will receive an invoice from [facility]. Please contact [facility] at [phone] with questions or concerns regarding your invoice.   IF you received labwork today, you will receive an invoice from [facility]. Please contact [facility] at [phone] with questions or concerns regarding your invoice.   Our billing   staff will not be able to assist you with questions regarding bills from these companies.  You will be contacted with the lab results as soon as they are available. The fastest way to get your results is to activate your My Chart account. Instructions are located on the last page of this paperwork. If you have not heard from us regarding the results in 2 weeks, please contact this office.      

## 2020-05-16 NOTE — Progress Notes (Signed)
3/23/20229:09 AM  [name] 03/06/1955, 65 y.o., female [id number]  Chief Complaint  Patient presents with  . Diabetes  . Hypertension    Usually 117/72  . Hyperlipidemia    HPI:   Patient is a 65 y.o. female with past medical history significant for HTN, DM2, HLP, R breast cancer, GERD, osteoporosis, anemia who presents today for routine follow-up.  Has a new PCP set up for May  HTN Metoprolol 50mg  bid Takes BP at home 116-120/x BP Readings from Last 3 Encounters:  05/16/20 132/82  02/16/20 (!) 164/86  11/08/19 127/82   HLD Simvastatin 20mg  daily Lab Results  Component Value Date   CHOL 130 11/01/2019   HDL 39 (L) 11/01/2019   LDLCALC 61 11/01/2019   TRIG 179 (H) 11/01/2019   CHOLHDL 3.3 11/01/2019   DM Metformin 500mg  bid Had previously tired glipizide Eye exam 11/21/19 Last OV started Farxiga 5 mg Had tried the [place] but didn't feel well Then stopped the medication Felt like she had fatigue and palpitations with that medication Lab Results  Component Value Date   HGBA1C 7.0 05/16/2020   Osteoporosis Fosamax weekly Vit D3 daily Last DEXA 05/05/19 Arthritis Has not been using anything for joint pain Does not want to use anything oral or creams  Screening Cologuard: 09/01/17 negative (due 7/22) Health Maintenance  Topic Date Due  . COVID-19 Vaccine (3 - Moderna risk 4-dose series) 09/10/2019  . MAMMOGRAM  02/15/2021 (Originally 02/09/2020)  . HEMOGLOBIN A1C  08/16/2020  . Fecal DNA (Cologuard)  09/01/2020  . FOOT EXAM  11/07/2020  . URINE MICROALBUMIN  11/07/2020  . OPHTHALMOLOGY EXAM  11/20/2020  . DEXA SCAN  05/04/2021  . PAP SMEAR-Modifier  11/18/2021  . TETANUS/TDAP  03/03/2028  . INFLUENZA VACCINE  Completed  . Hepatitis C Screening  Completed  . HIV Screening  Completed  . HPV VACCINES  Aged Out     Depression screen [facility] 2/9 05/16/2020 02/16/2020 05/17/2019  Decreased Interest 0 0 0  Down, Depressed, Hopeless 0 0 0  PHQ - 2  Score 0 0 0    Fall Risk  05/16/2020 02/16/2020 11/08/2019 08/09/2019 05/17/2019  Falls in the past year? 0 0 0 0 0  Number falls in past yr: 0 0 0 0 0  Injury with Fall? 0 0 0 0 0  Risk for fall due to : - No Fall Risks - - -  Follow up Falls evaluation completed Falls evaluation completed - - Falls evaluation completed     Allergies  Allergen Reactions  . Asa Buff (Mag [Buffered Aspirin] Nausea And Vomiting    Prior to Admission medications   Medication Sig Start Date End Date Taking? Authorizing Provider  alendronate (FOSAMAX) 35 MG tablet Take 1 tablet (35 mg total) by mouth every 7 (seven) days. Take with a full glass of water on an empty stomach. 01/31/19  Yes Truitt Merle, MD  Cholecalciferol (VITAMIN D3) 50 MCG (2000 UT) TABS Take 1 tablet by mouth daily. 11/24/18  Yes Wendall Mola, NP  metFORMIN (GLUCOPHAGE) 500 MG tablet Take 1 tablet (500 mg total) by mouth 2 (two) times daily with a meal. 08/09/19  Yes Jacelyn Pi, Lilia Argue, MD  metoprolol tartrate (LOPRESSOR) 50 MG tablet Take 1 tablet (50 mg total) by mouth 2 (two) times daily. 08/09/19  Yes Jacelyn Pi, Lilia Argue, MD  Multiple Vitamin (MULITIVITAMIN WITH MINERALS) TABS Take 2 tablets by mouth daily. Reported on 08/30/2015   Yes [provider]  simvastatin (ZOCOR) 20 MG tablet Take 1 tablet (20 mg total) by mouth daily. 08/09/19  Yes Jacelyn Pi, Lilia Argue, MD    Past Medical History:  Diagnosis Date  . Anxiety    ATIVAN HELPS PT TO SLEEP  . Breast cancer ([place])    breast right side -COMPLETED CHEMO WITH DR. Collier Salina RUBIN--EXCEPT ARIMIDEX  . Cough    PT HAS HAD COUGH FOR PAST MONTH-WHITE PHLEGM  . Hyperlipidemia   . Hypertension   . Indigestion    TAKING ZANTAC  . Personal history of chemotherapy 2012    Past Surgical History:  Procedure Laterality Date  . MASTECTOMY  09/12/10   right  . PORT-A-CATH REMOVAL  03/19/2011   Procedure: REMOVAL PORT-A-CATH;  Surgeon: Stark Klein, MD;  Location: [facility];  Service:  General;  Laterality: N/A;  . PORTACATH PLACEMENT      Social History   Tobacco Use  . Smoking status: Never Smoker  . Smokeless tobacco: Never Used  Substance Use Topics  . Alcohol use: No    Family History  Problem Relation Age of Onset  . Hypertension Mother   . Diabetes Mother   . Cancer Father        unknown type cancer   . Diabetes Sister   . Hypertension Sister   . Cancer Sister   . Thyroid disease Sister   . Breast cancer Neg Hx     Review of Systems  Constitutional: Negative for chills, fever and malaise/fatigue.  HENT: Negative for hearing loss and tinnitus.   Eyes: Negative for blurred vision and double vision.  Respiratory: Negative for cough, shortness of breath and wheezing.   Cardiovascular: Negative for chest pain, palpitations and leg swelling.  Gastrointestinal: Positive for heartburn. Negative for abdominal pain, blood in stool, constipation, diarrhea, nausea and vomiting.  Genitourinary: Positive for dysuria and frequency. Negative for hematuria and urgency.       Small amount when she goes  Musculoskeletal: Positive for joint pain (knee pain with walking, wrist pain). Negative for back pain.  Skin: Negative for rash.  Neurological: Negative for dizziness, weakness and headaches.     OBJECTIVE:  Today's Vitals   05/16/20 0826  BP: 132/82  Pulse: 76  Temp: (!) 97.5 F (36.4 C)  SpO2: 99%  Weight: 123 lb (55.8 kg)  Height: 4\' 11"  (1.499 m)   Body mass index is 24.84 kg/m.   Physical Exam Constitutional:      General: She is not in acute distress.    Appearance: Normal appearance. She is not ill-appearing.  HENT:     Head: Normocephalic.  Cardiovascular:     Rate and Rhythm: Normal rate and regular rhythm.     Pulses: Normal pulses.     Heart sounds: Normal heart sounds. No murmur heard. No friction rub. No gallop.   Pulmonary:     Effort: Pulmonary effort is normal. No respiratory distress.     Breath sounds: Normal breath  sounds. No stridor. No wheezing, rhonchi or rales.  Abdominal:     General: Bowel sounds are normal.     Palpations: Abdomen is soft.     Tenderness: There is no abdominal tenderness.  Musculoskeletal:     Right lower leg: No edema.     Left lower leg: No edema.  Skin:    General: Skin is warm and dry.  Neurological:     Mental Status: She is alert and oriented to person, place, and time.  Psychiatric:  Mood and Affect: Mood normal.        Behavior: Behavior normal.     Results for orders placed or performed in visit on 05/16/20 (from the past 24 hour(s))  POCT glucose (manual entry)     Status: Abnormal   Collection Time: 05/16/20  8:45 AM  Result Value Ref Range   POC Glucose 145 (A) 70 - 99 mg/dl  POCT glycosylated hemoglobin (Hb A1C)     Status: Abnormal   Collection Time: 05/16/20  8:53 AM  Result Value Ref Range   Hemoglobin A1C     HbA1c POC (<> result, manual entry) 7.0 4.0 - 5.6 %   HbA1c, POC (prediabetic range)     HbA1c, POC (controlled diabetic range)      No results found.   ASSESSMENT and PLAN  Problem List Items Addressed This Visit      Endocrine   Diabetes type 2, controlled ([place]) - Primary (Chronic)   Relevant Medications   metFORMIN (GLUCOPHAGE) 500 MG tablet   simvastatin (ZOCOR) 20 MG tablet   Other Relevant Orders   POCT glycosylated hemoglobin (Hb A1C) (Completed)   POCT glucose (manual entry) (Completed)     Musculoskeletal and Integument   Osteoporosis   Relevant Medications   alendronate (FOSAMAX) 35 MG tablet    Other Visit Diagnoses    Elevated triglycerides with high cholesterol       Relevant Medications   metoprolol tartrate (LOPRESSOR) 50 MG tablet   simvastatin (ZOCOR) 20 MG tablet         Plan  Continue Metfrormin, A1c at goal no need to restart Farxiga  BP at goal no medication changes needed  LDL<70, no medication changes needed will do labs next OV  R/se/b of medications discussed.  Health main. Up to  date  Return in about 3 months (around 08/16/2020).    Sabrina Foley Jerren Flinchbaugh, FNP-BC Primary Care at [address] [place], [place] [address] Ph.  [phone] Fax [phone]

## 2020-05-24 ENCOUNTER — Ambulatory Visit
Admission: RE | Admit: 2020-05-24 | Discharge: 2020-05-24 | Disposition: A | Discharge status: Home / Self Care | Payer: Medicare Other | Source: Ambulatory Visit | Attending: Family Medicine | Admitting: Family Medicine

## 2020-05-24 ENCOUNTER — Other Ambulatory Visit: Payer: Self-pay

## 2020-05-24 DIAGNOSIS — Z1231 Encounter for screening mammogram for malignant neoplasm of breast: Secondary | ICD-10-CM | POA: Diagnosis not present

## 2020-07-03 DIAGNOSIS — E7800 Pure hypercholesterolemia, unspecified: Secondary | ICD-10-CM | POA: Diagnosis not present

## 2020-07-03 DIAGNOSIS — E1169 Type 2 diabetes mellitus with other specified complication: Secondary | ICD-10-CM | POA: Diagnosis not present

## 2020-07-03 DIAGNOSIS — I10 Essential (primary) hypertension: Secondary | ICD-10-CM | POA: Diagnosis not present

## 2020-08-01 ENCOUNTER — Ambulatory Visit: Payer: Medicare Other | Admitting: Nurse Practitioner

## 2020-08-01 ENCOUNTER — Other Ambulatory Visit: Payer: Medicare Other

## 2020-08-03 ENCOUNTER — Other Ambulatory Visit: Payer: Self-pay

## 2020-08-03 DIAGNOSIS — Z170 Estrogen receptor positive status [ER+]: Secondary | ICD-10-CM

## 2020-08-03 DIAGNOSIS — C50411 Malignant neoplasm of upper-outer quadrant of right female breast: Secondary | ICD-10-CM

## 2020-08-05 NOTE — Progress Notes (Signed)
[facility]   [phone] [phone]   Clinic Follow up Note   Patient Care Team: Just, Laurita Quint, FNP (Inactive) as PCP - General (Family Medicine) Truitt Merle, MD as Consulting Physician (Hematology) 08/06/2020  CHIEF COMPLAINT: Follow up right breast cancer   SUMMARY OF ONCOLOGIC HISTORY: Oncology History Overview Note  Breast cancer of upper-outer quadrant of right female breast [facility])   Staging form: Breast, AJCC 7th Edition   - Clinical stage from 04/22/2010: Stage IIB (T3, N0, M0) - Signed by Truitt Merle, MD on 01/21/2016   - Pathologic stage from 09/11/2010: Stage IA (yT1c, N0, cM0) - Signed by Truitt Merle, MD on 01/21/2016     Breast cancer of upper-outer quadrant of right female breast ([place])  04/22/2010 Initial Diagnosis   Breast cancer of upper-outer quadrant of right female breast ([place])    04/22/2010 Receptors her2    ER 50% positive, PR negative, HER-2 positive    04/22/2010 Initial Biopsy    Right  Breast mass pops A showed invasive ductal carcinoma, G3    04/25/2010 Imaging   Breast MRI showed a 7.7 cm abnormal linear enhancement and confluent masses in the upper outer quadrant of the right breast which correlate with the recently biopsied breast cancer.    05/09/2010 Imaging   PET scan was  Negative for metastatic adenopathy ordistant metastasis.    04/2010 -  Chemotherapy   She received 4 cycle of Abraxane and Herceptin every 3 weeks     09/11/2010 Surgery    Right breast mastectomy and sentinel lymph node biopsy    09/2010 - 08/2017 Anti-estrogen oral therapy    Adjuvant anastrozole 1 mg daily      CURRENT THERAPY: Surveillance   INTERVAL HISTORY: Ms. Sheriff returns for follow up as scheduled. She was last seen 01/2019. Left mammo 04/2020 was negative.  She continues to have pain in the left chest from her port site down to her left breast, pain is sharp, unchanged in nature over many years.  There is no associated mass,  nipple discharge, or skin change.  She has joint pain in the right hand, denies any other bone or joint pain.  Denies headaches, fever, chills, cough, chest pain, or decreased energy or appetite.  She is up-to-date on Pap smear and COVID vaccines.  She is establishing with a new PCP who is managing Cologuard/colon cancer screening.   MEDICAL HISTORY:  Past Medical History:  Diagnosis Date   Anxiety    ATIVAN HELPS PT TO SLEEP   Breast cancer ([place])    breast right side -COMPLETED CHEMO WITH DR. Collier Salina RUBIN--EXCEPT ARIMIDEX   Cough    PT HAS HAD COUGH FOR PAST MONTH-WHITE PHLEGM   Hyperlipidemia    Hypertension    Indigestion    TAKING ZANTAC   Personal history of chemotherapy 2012    SURGICAL HISTORY: Past Surgical History:  Procedure Laterality Date   MASTECTOMY  09/12/10   right   PORT-A-CATH REMOVAL  03/19/2011   Procedure: REMOVAL PORT-A-CATH;  Surgeon: Stark Klein, MD;  Location: [facility];  Service: General;  Laterality: N/A;   PORTACATH PLACEMENT      I have reviewed the social history and family history with the patient and they are unchanged from previous note.  ALLERGIES:  is allergic to asa buff (mag [buffered aspirin].  MEDICATIONS:  Current Outpatient Medications  Medication Sig Dispense Refill   alendronate (FOSAMAX) 35 MG tablet Take 1 tablet (35 mg total) by  mouth every 7 (seven) days. Take with a full glass of water on an empty stomach. 12 tablet 3   Cholecalciferol (VITAMIN D3) 50 MCG (2000 UT) TABS Take 1 tablet by mouth daily. 90 tablet 2   Cysteamine Bitartrate (PROCYSBI) 300 MG PACK Check blood sugar fasting once daily. ONE TOUCH. DX E11.69     ferrous sulfate 324 MG TBEC Take by mouth.     glucose blood (PRECISION QID TEST) test strip Check blood sugar fasting once daily. ONE TOUCH. DX E11.69     Lancets (ONETOUCH DELICA PLUS [id number]) MISC Apply topically daily.     losartan (COZAAR) 25 MG tablet Take 25 mg by mouth at bedtime.     metFORMIN (GLUCOPHAGE)  500 MG tablet Take 1 tablet (500 mg total) by mouth 2 (two) times daily with a meal. 180 tablet 1   MODERNA COVID-19 VACCINE 100 MCG/0.5ML injection      Multiple Vitamin (MULITIVITAMIN WITH MINERALS) TABS Take 2 tablets by mouth daily. Reported on 08/30/2015     Omega-3 1000 MG CAPS Take by mouth.     ONETOUCH VERIO test strip daily.     simvastatin (ZOCOR) 20 MG tablet Take 1 tablet (20 mg total) by mouth daily. 90 tablet 3   No current facility-administered medications for this visit.    PHYSICAL EXAMINATION: ECOG PERFORMANCE STATUS: 0 - Asymptomatic  Vitals:   08/06/20 0900  BP: 123/62  Pulse: 89  Resp: 18  Temp: 97.6 F (36.4 C)  SpO2: 100%   Filed Weights   08/06/20 0900  Weight: 122 lb (55.3 kg)    GENERAL:alert, no distress and comfortable SKIN: No rash EYES: sclera clear NECK: Without mass LYMPH:  no palpable cervical or supraclavicular lymphadenopathy  LUNGS: normal breathing effort HEART: no lower extremity edema NEURO: alert & oriented x 3 with fluent speech, no focal motor/sensory deficits Breast exam: Right breast surgically absent.  Incisions completely healed without nodularity or mass along the right chest wall or axilla.  Left breast without nipple discharge or inversion.  No palpable mass in the left breast or axilla that I could appreciate.  Healed left chest port site without palpable abnormality  LABORATORY DATA:  I have reviewed the data as listed CBC Latest Ref Rng & Units 08/06/2020 05/17/2019 01/31/2019  WBC 4.0 - 10.5 K/uL 8.0 5.9 6.5  Hemoglobin 12.0 - 15.0 g/dL 10.8(L) 11.5 10.9(L)  Hematocrit 36.0 - 46.0 % 35.1(L) 37.2 36.2  Platelets 150 - 400 K/uL 314 - 280     CMP Latest Ref Rng & Units 08/06/2020 02/16/2020 11/01/2019  Glucose 70 - 99 mg/dL 196(H) 163(H) 164(H)  BUN 8 - 23 mg/dL _0 Creatinine 0.44 - 1.00 mg/dL 0.95 0.81 0.94  Sodium 135 - 145 mmol/L 141 140 143  Potassium 3.5 - 5.1 mmol/L 4.9 4.4 4.5  Chloride 98 - 111 mmol/L 103  99 105  CO2 22 - 32 mmol/L _1 Calcium 8.9 - 10.3 mg/dL 9.4 9.6 9.6  Total Protein 6.5 - 8.1 g/dL 7.6 7.1 7.1  Total Bilirubin 0.3 - 1.2 mg/dL 0.8 0.3 0.7  Alkaline Phos 38 - 126 U/L 54 68 59  AST 15 - 41 U/L _2 ALT 0 - 44 U/L _3 RADIOGRAPHIC STUDIES: I have personally reviewed the radiological images as listed and agreed with the findings in the report. No results found.   ASSESSMENT & PLAN: [name] is a 65  y.o. female with     1. 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 S/p neo-adjuvant chemo Abraxane and Herceptin, but did not complete 1 year Herceptin due to side effects. S/p right breast mastectomy and 7 years of adjuvant anti-estrogen therapy with anastrozole.  -04/2019 L mammo and 12/2019 L [place] (for breast pain) were negative. Continue annual L screening mammo -Continue surveillance. Given her HER2 + breast cancer she has small risk of late recurrence.  F/up annually     2. Microcytic Anemia, Hemoglobin E trait -Chronic since 2017, overall mild and stable -TSH normal in 2017. Based on hemoglobin electrophoresis, labs consistent with Hemoglobin E trait, a genetic disorder -recently restarted oral iron   3. Osteoporosis -08/2016 Bone Scan showed Osteoporosis in the right femur neck with a T-Score of -2.7. -She knows to watch for falls due to her right hip being at high risk for fracture. -She has been taking Fosamax since 2012 and has had partial response. She will continue Fosamax, calcium and Vit D. -DEXA 05/05/2019 showed stable osteoporosis  4. HTN and DM -She will continue follow-up with her primary care physician  and continue medication   5. Cancer screening -Per patient's daughter she is up-to-date on Pap screening -Last Cologuard negative 09/01/2017, she recently established with a new PCP who is managing this   Disposition: Ms. Altmann is clinically doing well.   Breast exam is benign, labs unremarkable, with stable anemia and elevated BG.  Mammogram 04/2020 is negative.  Overall there is no clinical concern for breast cancer recurrence.  I suspect the left chest wall/breast pain is related to distant Port-A-Cath surgery.  I discussed the possibility this could be neuropathic pain and offered gabapentin, she declined.  We will continue monitoring.  She knows to call/notify with any new changes or concerns.  Continue breast cancer surveillance, left mammogram annually in March, and healthy lifestyle.  Continue age-appropriate cancer screenings per PCP.  For osteoporosis, continue calcium, vitamin D, and Fosamax.  Lab and routine surveillance visit in 1 year, or sooner if needed.  All questions were answered. The patient knows to call the clinic with any problems, questions or concerns. No barriers to learning was detected.     Alla Feeling, NP 08/06/20

## 2020-08-06 ENCOUNTER — Inpatient Hospital Stay: Payer: Medicare Other | Attending: Nurse Practitioner

## 2020-08-06 ENCOUNTER — Telehealth: Payer: Self-pay | Admitting: Hematology

## 2020-08-06 ENCOUNTER — Other Ambulatory Visit: Payer: Self-pay

## 2020-08-06 ENCOUNTER — Inpatient Hospital Stay (HOSPITAL_BASED_OUTPATIENT_CLINIC_OR_DEPARTMENT_OTHER): Payer: Medicare Other | Admitting: Nurse Practitioner

## 2020-08-06 VITALS — BP 123/62 | HR 89 | Temp 97.6°F | Resp 18 | Ht 59.0 in | Wt 122.0 lb

## 2020-08-06 DIAGNOSIS — M810 Age-related osteoporosis without current pathological fracture: Secondary | ICD-10-CM | POA: Diagnosis not present

## 2020-08-06 DIAGNOSIS — M2550 Pain in unspecified joint: Secondary | ICD-10-CM | POA: Insufficient documentation

## 2020-08-06 DIAGNOSIS — Z170 Estrogen receptor positive status [ER+]: Secondary | ICD-10-CM | POA: Insufficient documentation

## 2020-08-06 DIAGNOSIS — Z79899 Other long term (current) drug therapy: Secondary | ICD-10-CM | POA: Diagnosis not present

## 2020-08-06 DIAGNOSIS — R079 Chest pain, unspecified: Secondary | ICD-10-CM | POA: Insufficient documentation

## 2020-08-06 DIAGNOSIS — Z79811 Long term (current) use of aromatase inhibitors: Secondary | ICD-10-CM | POA: Insufficient documentation

## 2020-08-06 DIAGNOSIS — Z886 Allergy status to analgesic agent status: Secondary | ICD-10-CM | POA: Diagnosis not present

## 2020-08-06 DIAGNOSIS — I10 Essential (primary) hypertension: Secondary | ICD-10-CM | POA: Diagnosis not present

## 2020-08-06 DIAGNOSIS — D509 Iron deficiency anemia, unspecified: Secondary | ICD-10-CM | POA: Insufficient documentation

## 2020-08-06 DIAGNOSIS — M79641 Pain in right hand: Secondary | ICD-10-CM | POA: Diagnosis not present

## 2020-08-06 DIAGNOSIS — C50411 Malignant neoplasm of upper-outer quadrant of right female breast: Secondary | ICD-10-CM | POA: Diagnosis not present

## 2020-08-06 DIAGNOSIS — E119 Type 2 diabetes mellitus without complications: Secondary | ICD-10-CM | POA: Insufficient documentation

## 2020-08-06 LAB — CMP (CANCER CENTER ONLY)
ALT: 21 U/L (ref 0–44)
AST: 18 U/L (ref 15–41)
Albumin: 3.9 g/dL (ref 3.5–5.0)
Alkaline Phosphatase: 54 U/L (ref 38–126)
Anion gap: 11 (ref 5–15)
BUN: 16 mg/dL (ref 8–23)
CO2: 27 mmol/L (ref 22–32)
Calcium: 9.4 mg/dL (ref 8.9–10.3)
Chloride: 103 mmol/L (ref 98–111)
Creatinine: 0.95 mg/dL (ref 0.44–1.00)
GFR, Estimated: >60 mL/min (ref >60)
Glucose, Bld: 196 mg/dL — ABNORMAL HIGH (ref 70–99)
Potassium: 4.9 mmol/L (ref 3.5–5.1)
Sodium: 141 mmol/L (ref 135–145)
Total Bilirubin: 0.8 mg/dL (ref 0.3–1.2)
Total Protein: 7.6 g/dL (ref 6.5–8.1)

## 2020-08-06 LAB — CBC WITH DIFFERENTIAL (CANCER CENTER ONLY)
Abs Immature Granulocytes: 0.02 10*3/uL (ref 0.00–0.07)
Basophils Absolute: 0 10*3/uL (ref 0.0–0.1)
Basophils Relative: 1 %
Eosinophils Absolute: 0.6 10*3/uL — ABNORMAL HIGH (ref 0.0–0.5)
Eosinophils Relative: 8 %
HCT: 35.1 % — ABNORMAL LOW (ref 36.0–46.0)
Hemoglobin: 10.8 g/dL — ABNORMAL LOW (ref 12.0–15.0)
Immature Granulocytes: 0 %
Lymphocytes Relative: 30 %
Lymphs Abs: 2.4 10*3/uL (ref 0.7–4.0)
MCH: 22.9 pg — ABNORMAL LOW (ref 26.0–34.0)
MCHC: 30.8 g/dL (ref 30.0–36.0)
MCV: 74.5 fL — ABNORMAL LOW (ref 80.0–100.0)
Monocytes Absolute: 0.5 10*3/uL (ref 0.1–1.0)
Monocytes Relative: 6 %
Neutro Abs: 4.4 10*3/uL (ref 1.7–7.7)
Neutrophils Relative %: 55 %
Platelet Count: 314 10*3/uL (ref 150–400)
RBC: 4.71 MIL/uL (ref 3.87–5.11)
RDW: 13.5 % (ref 11.5–15.5)
WBC Count: 8 10*3/uL (ref 4.0–10.5)
nRBC: 0 % (ref 0.0–0.2)

## 2020-08-06 NOTE — Telephone Encounter (Signed)
Scheduled per 06/13 los, updated calender was handed to patient.

## 2020-08-07 ENCOUNTER — Telehealth: Payer: Self-pay

## 2020-08-07 NOTE — Telephone Encounter (Signed)
This nurse spoke with patient's daughter who requested information to send over a dental clearance for the MD to sign.  This nurse provided the fax number and stated that she would send the form today.  No further questions or concerns at this time.

## 2021-07-15 ENCOUNTER — Other Ambulatory Visit: Payer: Self-pay | Admitting: Family Medicine

## 2021-07-15 DIAGNOSIS — Z1231 Encounter for screening mammogram for malignant neoplasm of breast: Secondary | ICD-10-CM

## 2021-07-24 ENCOUNTER — Ambulatory Visit
Admission: RE | Admit: 2021-07-24 | Discharge: 2021-07-24 | Disposition: A | Discharge status: Home / Self Care | Payer: Medicare Other | Source: Ambulatory Visit | Attending: Family Medicine | Admitting: Family Medicine

## 2021-07-24 ENCOUNTER — Telehealth: Payer: Self-pay | Admitting: Hematology

## 2021-07-24 DIAGNOSIS — Z1231 Encounter for screening mammogram for malignant neoplasm of breast: Secondary | ICD-10-CM

## 2021-07-24 NOTE — Telephone Encounter (Signed)
Left message with rescheduled upcoming appointment due to provider's PAL. 

## 2021-08-05 ENCOUNTER — Ambulatory Visit: Payer: Medicare Other | Admitting: Hematology

## 2021-08-05 ENCOUNTER — Other Ambulatory Visit: Payer: Medicare Other

## 2021-08-15 ENCOUNTER — Inpatient Hospital Stay: Payer: Medicare Other | Admitting: Hematology

## 2021-08-15 ENCOUNTER — Inpatient Hospital Stay: Payer: Medicare Other

## 2022-06-20 ENCOUNTER — Other Ambulatory Visit: Payer: Self-pay | Admitting: Family Medicine

## 2022-06-20 DIAGNOSIS — Z1231 Encounter for screening mammogram for malignant neoplasm of breast: Secondary | ICD-10-CM

## 2022-08-01 ENCOUNTER — Ambulatory Visit
Admission: RE | Admit: 2022-08-01 | Discharge: 2022-08-01 | Disposition: A | Discharge status: Home / Self Care | Payer: 59 | Source: Ambulatory Visit | Attending: Family Medicine | Admitting: Family Medicine

## 2022-08-01 DIAGNOSIS — Z1231 Encounter for screening mammogram for malignant neoplasm of breast: Secondary | ICD-10-CM

## 2023-05-12 LAB — EXTERNAL GENERIC LAB PROCEDURE: COLOGUARD: NEGATIVE

## 2023-05-12 LAB — COLOGUARD: COLOGUARD: NEGATIVE

## 2023-06-24 ENCOUNTER — Other Ambulatory Visit: Payer: Self-pay | Admitting: Family Medicine

## 2023-06-24 DIAGNOSIS — Z0000 Encounter for general adult medical examination without abnormal findings: Secondary | ICD-10-CM

## 2023-08-03 ENCOUNTER — Ambulatory Visit
Admission: RE | Admit: 2023-08-03 | Discharge: 2023-08-03 | Disposition: A | Discharge status: Home / Self Care | Source: Ambulatory Visit | Attending: Family Medicine | Admitting: Family Medicine

## 2023-08-03 DIAGNOSIS — Z0000 Encounter for general adult medical examination without abnormal findings: Secondary | ICD-10-CM
